# Patient Record
Sex: Male | Born: 1985 | Race: Black or African American | Hispanic: No | Marital: Married | Smoking: Former smoker
Health system: Southern US, Community
[De-identification: ages and names within clinical notes are randomized; demographics above are authoritative.]

## PROBLEM LIST (undated history)

## (undated) DIAGNOSIS — I10 Essential (primary) hypertension: Secondary | ICD-10-CM

---

## 2010-03-10 ENCOUNTER — Emergency Department (HOSPITAL_COMMUNITY)
Admission: EM | Admit: 2010-03-10 | Discharge: 2010-03-10 | Payer: Self-pay | Source: Home / Self Care | Admitting: Family Medicine

## 2010-08-30 ENCOUNTER — Emergency Department (HOSPITAL_COMMUNITY)
Admission: EM | Admit: 2010-08-30 | Discharge: 2010-08-30 | Disposition: A | Discharge status: Home / Self Care | Payer: Self-pay | Attending: Emergency Medicine | Admitting: Emergency Medicine

## 2010-08-30 DIAGNOSIS — M25579 Pain in unspecified ankle and joints of unspecified foot: Secondary | ICD-10-CM | POA: Insufficient documentation

## 2010-08-30 DIAGNOSIS — M25569 Pain in unspecified knee: Secondary | ICD-10-CM | POA: Insufficient documentation

## 2011-06-23 ENCOUNTER — Emergency Department (HOSPITAL_COMMUNITY)
Admission: EM | Admit: 2011-06-23 | Discharge: 2011-06-24 | Disposition: A | Discharge status: Home / Self Care | Payer: Self-pay | Attending: Emergency Medicine | Admitting: Emergency Medicine

## 2011-06-23 ENCOUNTER — Encounter (HOSPITAL_COMMUNITY): Payer: Self-pay | Admitting: Emergency Medicine

## 2011-06-23 DIAGNOSIS — K089 Disorder of teeth and supporting structures, unspecified: Secondary | ICD-10-CM | POA: Insufficient documentation

## 2011-06-23 NOTE — ED Notes (Signed)
PT. REPORTS UPPER AND LOWER MOLAR PAIN FOR SEVERAL DAYS .

## 2011-06-24 NOTE — ED Notes (Signed)
Called x 2 no answer

## 2011-08-20 ENCOUNTER — Emergency Department (HOSPITAL_COMMUNITY): Payer: Self-pay

## 2011-08-20 ENCOUNTER — Emergency Department (HOSPITAL_COMMUNITY)
Admission: EM | Admit: 2011-08-20 | Discharge: 2011-08-20 | Disposition: A | Discharge status: Home / Self Care | Payer: Self-pay | Attending: Emergency Medicine | Admitting: Emergency Medicine

## 2011-08-20 ENCOUNTER — Encounter (HOSPITAL_COMMUNITY): Payer: Self-pay | Admitting: *Deleted

## 2011-08-20 DIAGNOSIS — M25519 Pain in unspecified shoulder: Secondary | ICD-10-CM | POA: Insufficient documentation

## 2011-08-20 DIAGNOSIS — W208XXA Other cause of strike by thrown, projected or falling object, initial encounter: Secondary | ICD-10-CM | POA: Insufficient documentation

## 2011-08-20 DIAGNOSIS — S4980XA Other specified injuries of shoulder and upper arm, unspecified arm, initial encounter: Secondary | ICD-10-CM | POA: Insufficient documentation

## 2011-08-20 DIAGNOSIS — S46909A Unspecified injury of unspecified muscle, fascia and tendon at shoulder and upper arm level, unspecified arm, initial encounter: Secondary | ICD-10-CM | POA: Insufficient documentation

## 2011-08-20 DIAGNOSIS — S4991XA Unspecified injury of right shoulder and upper arm, initial encounter: Secondary | ICD-10-CM

## 2011-08-20 MED ORDER — IBUPROFEN 600 MG PO TABS: 600.0000 mg | ORAL_TABLET | Freq: Four times a day (QID) | ORAL | Status: AC | PRN

## 2011-08-20 MED ORDER — OXYCODONE-ACETAMINOPHEN 5-325 MG PO TABS
1.0000 | ORAL_TABLET | Freq: Once | ORAL | Status: AC
  Administered 2011-08-20: 1 via ORAL
  Filled 2011-08-20: qty 1

## 2011-08-20 NOTE — ED Provider Notes (Signed)
Medical screening examination/treatment/procedure(s) were performed by non-physician practitioner and as supervising physician I was immediately available for consultation/collaboration.   Celene Kras, MD 08/20/11 [phone]

## 2011-08-20 NOTE — ED Notes (Signed)
Ortho notified of sling placement to right arm.

## 2011-08-20 NOTE — ED Provider Notes (Signed)
History     CSN: [id number]  Arrival date & time 08/20/11  1721   First MD Initiated Contact with Patient 08/20/11 1727      No chief complaint on file.   (Consider location/radiation/quality/duration/timing/severity/associated sxs/prior treatment) HPI  26 year old male presents complaining of right arm injury. Patient states he went outside to smoke when a branch from a tree fell and hit him on his right upper arm. Patient states the branch fell due to high wind. He denies hitting his head or having loss of consciousness. He is complaining of pain to his right shoulder and right upper arm. Described pain as a sharp sensation worsening with movement. Sts his hand is tingling. He denies any other injury. He denies bleeding. Patient denies any other numbness sensation. Describe pain is 10 out of 10.  No past medical history on file.  No past surgical history on file.  No family history on file.  History  Substance Use Topics  . Smoking status: Never Smoker   . Smokeless tobacco: Not on file  . Alcohol Use: No      Review of Systems  Constitutional: Negative for fever.  Musculoskeletal: Negative for back pain.  Skin: Negative for rash and wound.  Neurological: Negative for numbness and headaches.    Allergies  Review of patient's allergies indicates no known allergies.  Home Medications  No current outpatient prescriptions on file.  BP 126/81  Pulse 77  Temp 98.3 F (36.8 C) (Oral)  Resp 18  SpO2 100%  Physical Exam  Nursing note and vitals reviewed. Constitutional: He is oriented to person, place, and time. He appears well-developed and well-nourished. No distress.  HENT:  Head: Normocephalic and atraumatic.  Eyes: Conjunctivae are normal.  Neck: Normal range of motion. Neck supple.  Cardiovascular:  Pulses:      Radial pulses are 2+ on the right side.  Pulmonary/Chest: He exhibits no tenderness.  Abdominal: There is no tenderness.  Musculoskeletal:    Right shoulder: He exhibits decreased range of motion, tenderness, bony tenderness and pain. He exhibits no swelling, no effusion, no crepitus, no deformity and no laceration.       Right elbow: He exhibits normal range of motion, no swelling, no effusion, no deformity and no laceration.       Right wrist: Normal.       Arms: Neurological: He is alert and oriented to person, place, and time. No sensory deficit. GCS eye subscore is 4. GCS verbal subscore is 5. GCS motor subscore is 6.  Skin: Skin is warm. No rash noted.    ED Course  Procedures (including critical care time)  Labs Reviewed - No data to display No results found.   No diagnosis found.  No results found for this or any previous visit. Dg Shoulder Right  08/20/2011  *RADIOLOGY REPORT*  Clinical Data: Trauma, pain  RIGHT SHOULDER - 2+ VIEW  Comparison: 08/20/2011  Findings: Normal alignment without fracture.  No significant degenerative process.  Visualized right ribs intact.  IMPRESSION: No acute osseous finding  Original Report Authenticated By: Judie Petit. Ruel Favors, M.D.   Dg Humerus Right  08/20/2011  *RADIOLOGY REPORT*  Clinical Data:  trauma, injury  RIGHT HUMERUS - 2+ VIEW  Comparison: 08/20/2011  Findings:  intact right humerus.  Normal alignment.  Negative for fracture.  IMPRESSION: No acute osseous finding  Original Report Authenticated By: Judie Petit. Ruel Favors, M.D.      MDM  Right shoulder and right upper arm injury from a  falling tree branch. On evaluation patient has no overlying skin changes and no obvious deformity noted. However he is markedly tenderness even with light palpation throughout the shoulders and upper arm. Normal grip strength.  Radial pulse 2+. X-ray ordered, pain medication given. No other obvious injury.        Fayrene Helper, PA-C 08/20/11 1834

## 2011-08-20 NOTE — Progress Notes (Signed)
Orthopedic Tech Progress Note Patient Details:  Darryl Thomas Dec 29, 1985 [id number]  Ortho Devices Type of Ortho Device: Arm foam sling Ortho Device/Splint Location: right arm Ortho Device/Splint Interventions: Application   Nikki Dom 08/20/2011, 6:46 PM

## 2011-08-20 NOTE — Discharge Instructions (Signed)

## 2011-08-20 NOTE — ED Notes (Signed)
Patient brought in by EMS for RIght Upper Extremity Pain- Patient is able to move ext, fingers and has + pulses.  MD currently at bedside

## 2012-03-21 ENCOUNTER — Emergency Department (HOSPITAL_COMMUNITY)
Admission: EM | Admit: 2012-03-21 | Discharge: 2012-03-21 | Disposition: A | Discharge status: Home / Self Care | Payer: Self-pay | Attending: Emergency Medicine | Admitting: Emergency Medicine

## 2012-03-21 ENCOUNTER — Encounter (HOSPITAL_COMMUNITY): Payer: Self-pay | Admitting: *Deleted

## 2012-03-21 ENCOUNTER — Emergency Department (HOSPITAL_COMMUNITY): Payer: Self-pay

## 2012-03-21 DIAGNOSIS — R0602 Shortness of breath: Secondary | ICD-10-CM | POA: Insufficient documentation

## 2012-03-21 DIAGNOSIS — M2550 Pain in unspecified joint: Secondary | ICD-10-CM | POA: Insufficient documentation

## 2012-03-21 DIAGNOSIS — R51 Headache: Secondary | ICD-10-CM | POA: Insufficient documentation

## 2012-03-21 DIAGNOSIS — R509 Fever, unspecified: Secondary | ICD-10-CM | POA: Insufficient documentation

## 2012-03-21 DIAGNOSIS — F17200 Nicotine dependence, unspecified, uncomplicated: Secondary | ICD-10-CM | POA: Insufficient documentation

## 2012-03-21 DIAGNOSIS — J3489 Other specified disorders of nose and nasal sinuses: Secondary | ICD-10-CM | POA: Insufficient documentation

## 2012-03-21 DIAGNOSIS — J069 Acute upper respiratory infection, unspecified: Secondary | ICD-10-CM | POA: Insufficient documentation

## 2012-03-21 DIAGNOSIS — IMO0001 Reserved for inherently not codable concepts without codable children: Secondary | ICD-10-CM | POA: Insufficient documentation

## 2012-03-21 MED ORDER — ACETAMINOPHEN 325 MG PO TABS
650.0000 mg | ORAL_TABLET | Freq: Once | ORAL | Status: AC
  Administered 2012-03-21: 650 mg via ORAL
  Filled 2012-03-21: qty 2

## 2012-03-21 MED ORDER — IBUPROFEN 600 MG PO TABS: 600.0000 mg | ORAL_TABLET | Freq: Four times a day (QID) | ORAL | Status: DC | PRN

## 2012-03-21 MED ORDER — GUAIFENESIN 100 MG/5ML PO SYRP: 100.0000 mg | ORAL_SOLUTION | ORAL | Status: DC | PRN

## 2012-03-21 NOTE — ED Notes (Signed)
Returned from xray

## 2012-03-21 NOTE — ED Notes (Signed)
Patient transported to X-ray 

## 2012-03-21 NOTE — ED Notes (Signed)
Started with cough yesterday. Anterior CP with cough. States has been vomiting today.

## 2012-03-21 NOTE — ED Provider Notes (Signed)
History     CSN: [id number]  Arrival date & time 03/21/12  1036   First MD Initiated Contact with Patient 03/21/12 1054      Chief Complaint  Patient presents with  . Cough    (Consider location/radiation/quality/duration/timing/severity/associated sxs/prior treatment) HPI Darryl Thomas is a 27 y.o. male who presents to ED complaining of cough, congestion, sore throat for the last two days. States his daughter and girlfriend are sick at home. He did not take any medications at home. states went to work, felt worse, and was sent here. he has no medical problems. Does nto take any medications. he is not having shortness of breath, no chest pain. no n/v/d. History reviewed. No pertinent past medical history.  History reviewed. No pertinent past surgical history.  No family history on file.  History  Substance Use Topics  . Smoking status: Current Every Day Smoker  . Smokeless tobacco: Not on file  . Alcohol Use: No      Review of Systems  Constitutional: Positive for fever and chills.  HENT: Positive for congestion and sore throat. Negative for neck pain, neck stiffness, dental problem and voice change.   Respiratory: Positive for cough and shortness of breath.   Cardiovascular: Negative.   Gastrointestinal: Negative.   Musculoskeletal: Positive for myalgias and arthralgias.  Skin: Negative for rash.  Neurological: Positive for headaches.    Allergies  Review of patient's allergies indicates no known allergies.  Home Medications  No current outpatient prescriptions on file.  BP 123/72  Pulse 102  Temp 99.3 F (37.4 C) (Oral)  Resp 18  SpO2 100%  Physical Exam  Vitals reviewed. Constitutional: He appears well-developed and well-nourished. No distress.  Eyes: Conjunctivae normal are normal.  Neck: Normal range of motion. Neck supple.  Cardiovascular: Normal rate, regular rhythm and normal heart sounds.   Pulmonary/Chest: Effort normal and breath sounds normal.  No respiratory distress. He has no wheezes. He has no rales.  Musculoskeletal: He exhibits no edema.  Lymphadenopathy:    He has no cervical adenopathy.  Neurological: He is alert.  Skin: Skin is warm and dry. No rash noted.  Psychiatric: He has a normal mood and affect. His behavior is normal.    ED Course  Procedures (including critical care time)  Labs Reviewed - No data to display Dg Chest 2 View  03/21/2012  *RADIOLOGY REPORT*  Clinical Data: Shortness of breath and vomiting.  CHEST - 2 VIEW  Comparison: None.  Findings: Trachea is midline.  Heart size normal.  Lungs are somewhat low in volume but clear.  No pleural fluid. Visualized portion of the upper abdomen is unremarkable.  IMPRESSION: No acute findings.   Original Report Authenticated By: Leanna Battles, M.D.      1. Viral URI with cough       MDM  Pt with URI symptoms onset yesterda. He is non toxic appearing. CXR negative. VS normal other than HR of 102. Suspect viral URI, vs influenza. Treat symptomatically. Ibuprofen and robitussin at home. Over the counter medications for cold and flu. Follow up if not improving in 2 days.         Lottie Mussel, PA 03/21/12 1640

## 2012-03-21 NOTE — ED Notes (Signed)
Pt is here with cough and yellow sputum, chills, and body aches

## 2012-03-21 NOTE — ED Provider Notes (Signed)
Medical screening examination/treatment/procedure(s) were performed by non-physician practitioner and as supervising physician I was immediately available for consultation/collaboration.   Charles B. Bernette Mayers, MD 03/21/12 2140

## 2013-07-19 ENCOUNTER — Emergency Department (HOSPITAL_COMMUNITY)
Admission: EM | Admit: 2013-07-19 | Discharge: 2013-07-19 | Disposition: A | Discharge status: Home / Self Care | Payer: Self-pay | Attending: Emergency Medicine | Admitting: Emergency Medicine

## 2013-07-19 ENCOUNTER — Encounter (HOSPITAL_COMMUNITY): Payer: Self-pay | Admitting: Emergency Medicine

## 2013-07-19 DIAGNOSIS — S61509A Unspecified open wound of unspecified wrist, initial encounter: Secondary | ICD-10-CM | POA: Insufficient documentation

## 2013-07-19 DIAGNOSIS — F17200 Nicotine dependence, unspecified, uncomplicated: Secondary | ICD-10-CM | POA: Insufficient documentation

## 2013-07-19 DIAGNOSIS — W268XXA Contact with other sharp object(s), not elsewhere classified, initial encounter: Secondary | ICD-10-CM | POA: Insufficient documentation

## 2013-07-19 DIAGNOSIS — S61511A Laceration without foreign body of right wrist, initial encounter: Secondary | ICD-10-CM

## 2013-07-19 DIAGNOSIS — Y929 Unspecified place or not applicable: Secondary | ICD-10-CM | POA: Insufficient documentation

## 2013-07-19 DIAGNOSIS — Y9389 Activity, other specified: Secondary | ICD-10-CM | POA: Insufficient documentation

## 2013-07-19 NOTE — ED Notes (Signed)
Pt. Requesting pain medicine prescription but not willing to wait for RN to ask PA. Pt. Left without prescription.

## 2013-07-19 NOTE — ED Notes (Signed)
Previous triage entered by Ellin GoodieMegan Tayjah Lobdell, RN

## 2013-07-19 NOTE — ED Notes (Signed)
Pt presents to department for evaluation of R wrist laceration. Pt states he accidentally cut his hand with wire. 1 inch laceration noted upon arrival, bleeding controlled. Able to wiggle digits, sensation to hand intact. Pt is alert and oriented x4.

## 2013-07-19 NOTE — Discharge Instructions (Signed)
1. Medications: usual home medications 2. Treatment: rest, drink plenty of fluids, keep wound clean with warm soap and water, keep bandage dry 3. Follow Up: [facility] your primary care physician for suture removal and wound check in 7 days, return to the emergency department for evidence of infection including fevers, erythema or drainage  Sutured Wound Care Sutures are stitches that can be used to close wounds. Wound care helps prevent pain and infection.  HOME CARE INSTRUCTIONS   Rest and elevate the injured area until all the pain and swelling are gone.  Only take over-the-counter or prescription medicines for pain, discomfort, or fever as directed by your caregiver.  After 48 hours, gently wash the area with mild soap and water once a day, or as directed. Rinse off the soap. Pat the area dry with a clean towel. Do not rub the wound. This may cause bleeding.  Follow your caregiver's instructions for how often to change the bandage (dressing). Stop using a dressing after 2 days or after the wound stops draining.  If the dressing sticks, moisten it with soapy water and gently remove it.  Apply ointment on the wound as directed.  Avoid stretching a sutured wound.  Drink enough fluids to keep your urine clear or pale yellow.  Follow up with your caregiver for suture removal as directed.  Use sunscreen on your wound for the next 3 to 6 months so the scar will not darken. SEEK IMMEDIATE MEDICAL CARE IF:   Your wound becomes red, swollen, hot, or tender.  You have increasing pain in the wound.  You have a red streak that extends from the wound.  There is pus coming from the wound.  You have a fever.  You have shaking chills.  There is a bad smell coming from the wound.  You have persistent bleeding from the wound. MAKE SURE YOU:   Understand these instructions.  Will watch your condition.  Will get help right away if you are not doing  well or get worse. Document Released: 04/02/2004 Document Revised: 05/18/2011 Document Reviewed: 06/29/2010 [facility] Patient Information 2014 [place], [place].

## 2013-07-19 NOTE — ED Provider Notes (Signed)
Medical screening examination/treatment/procedure(s) were performed by non-physician practitioner and as supervising physician I was immediately available for consultation/collaboration.   Simya Tercero T Carrington Olazabal, MD 07/19/13 2320 

## 2013-07-19 NOTE — ED Provider Notes (Signed)
CSN: [id number]     Arrival date & time 07/19/13  1356 History   First MD Initiated Contact with Patient 07/19/13 1515     Chief Complaint  Patient presents with  . Laceration     (Consider location/radiation/quality/duration/timing/severity/associated sxs/prior Treatment) Patient is a 28 y.o. male presenting with skin laceration. The history is provided by the patient and medical records. No language interpreter was used.  Laceration   Darryl Thomas is a 28 y.o. male  with no known medical history presents to the Emergency Department complaining of acute, persistent laceration of the right wrist which occurred approximately one hour prior to arrival. He states he was taking apart a washing machine when he accidentally cut his wrist with a wire. He denies other associated symptoms. No aggravating or alleviating factors. He denies fever, chills, nausea or vomiting.  Patient without a history of diabetes.  Tetanus UTD in the last year.      History reviewed. No pertinent past medical history. History reviewed. No pertinent past surgical history. History reviewed. No pertinent family history. History  Substance Use Topics  . Smoking status: Current Every Day Smoker    Types: Cigarettes  . Smokeless tobacco: Not on file  . Alcohol Use: No    Review of Systems  Constitutional: Negative for fever.  Gastrointestinal: Negative for nausea and vomiting.  Skin: Positive for wound.  Allergic/Immunologic: Negative for immunocompromised state.  Neurological: Negative for weakness and numbness.  Hematological: Does not bruise/bleed easily.  Psychiatric/Behavioral: The patient is not nervous/anxious.       Allergies  Review of patient's allergies indicates no known allergies.  Home Medications   Prior to Admission medications   Not on File   BP 125/63  Pulse 80  Temp(Src) 97.6 F (36.4 C) (Oral)  Resp 18  SpO2 100% Physical Exam  Nursing note and vitals  reviewed. Constitutional: He is oriented to person, place, and time. He appears well-developed and well-nourished. No distress.  HENT:  Head: Normocephalic and atraumatic.  Eyes: Conjunctivae are normal. No scleral icterus.  Neck: Normal range of motion.  Cardiovascular: Normal rate, regular rhythm, normal heart sounds and intact distal pulses.   No murmur heard. Capillary refill < 3 sec  Pulmonary/Chest: Effort normal and breath sounds normal. No respiratory distress.  Musculoskeletal: Normal range of motion. He exhibits no edema.  ROM: Full range of motion of all fingers of the right hand and right wrist  Neurological: He is alert and oriented to person, place, and time.  Sensation: Intact to dull and sharp including 2 point discrimination intact to all fingers of the right hand Strength: 5 out of 5 including resisted flexion and extension of all fingers of the right hand and right wrist  Skin: Skin is warm and dry. He is not diaphoretic. No erythema.  3cm laceration to the right lateral wrist; clean edges and the wound is clean  Psychiatric: He has a normal mood and affect.    ED Course  LACERATION REPAIR Date/Time: 07/19/2013 3:29 PM Performed by: Dierdre ForthMUTHERSBAUGH, Areon Cocuzza Authorized by: Dierdre ForthMUTHERSBAUGH, Jeramia Saleeby Consent: Verbal consent obtained. Risks and benefits: risks, benefits and alternatives were discussed Consent given by: patient Patient understanding: patient states understanding of the procedure being performed Patient consent: the patient's understanding of the procedure matches consent given Procedure consent: procedure consent matches procedure scheduled Relevant documents: relevant documents present and verified Site marked: the operative site was marked Required items: required blood products, implants, devices, and special equipment available Patient identity confirmed: verbally  with patient and arm band Time out: Immediately prior to procedure a "time out" was called to  verify the correct patient, procedure, equipment, support staff and site/side marked as required. Body area: upper extremity Location details: right wrist Laceration length: 3 cm Foreign bodies: no foreign bodies Tendon involvement: none Nerve involvement: none Vascular damage: no Anesthesia: local infiltration Local anesthetic: lidocaine 2% without epinephrine Anesthetic total: 3.5 ml Patient sedated: no Preparation: Patient was prepped and draped in the usual sterile fashion. Irrigation solution: saline Irrigation method: syringe Amount of cleaning: standard Debridement: none Degree of undermining: none Skin closure: Ethilon Number of sutures: 4 Technique: simple Approximation: close Approximation difficulty: simple Dressing: 4x4 sterile gauze Patient tolerance: Patient tolerated the procedure well with no immediate complications.   (including critical care time) Labs Review Labs Reviewed - No data to display  Imaging Review No results found.   EKG Interpretation None      MDM   Final diagnoses:  Laceration of right wrist   Darryl Thomas presents for laceration to the right wrist.  Tdap UTD within the last year.  Pressure irrigation performed. Laceration occurred < 8 hours prior to repair which was well tolerated. Pt has no co morbidities to effect normal wound healing. Discussed suture home care w pt and answered questions. 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 Pt to f-u for wound check and suture removal in 7 days. Pt is hemodynamically stable w no complaints prior to dc.    It has been determined that no acute conditions requiring further emergency intervention are present at this time. The patient/guardian have been advised of the diagnosis and plan. We have discussed signs and symptoms that warrant return to the ED, such as changes or worsening in symptoms.   Vital signs are stable at discharge.   BP 125/63  Pulse 80  Temp(Src) 97.6  F (36.4 C) (Oral)  Resp 18  SpO2 100%  Patient/guardian has voiced understanding and agreed to follow-up with the PCP or specialist.       Dierdre ForthHannah Ileta Ofarrell, PA-C 07/19/13 1555

## 2013-07-19 NOTE — ED Notes (Signed)
Patient states was working on a dryer and a wire cut him in the R wrist.  Patient states he is unsure if anything is in the laceration.

## 2013-08-22 ENCOUNTER — Encounter (HOSPITAL_COMMUNITY): Payer: Self-pay | Admitting: Emergency Medicine

## 2013-08-22 ENCOUNTER — Emergency Department (HOSPITAL_COMMUNITY)
Admission: EM | Admit: 2013-08-22 | Discharge: 2013-08-22 | Disposition: A | Discharge status: Home / Self Care | Payer: Self-pay | Attending: Emergency Medicine | Admitting: Emergency Medicine

## 2013-08-22 ENCOUNTER — Emergency Department (HOSPITAL_COMMUNITY): Payer: Self-pay

## 2013-08-22 DIAGNOSIS — Y9239 Other specified sports and athletic area as the place of occurrence of the external cause: Secondary | ICD-10-CM | POA: Insufficient documentation

## 2013-08-22 DIAGNOSIS — Y92838 Other recreation area as the place of occurrence of the external cause: Secondary | ICD-10-CM

## 2013-08-22 DIAGNOSIS — S93409A Sprain of unspecified ligament of unspecified ankle, initial encounter: Secondary | ICD-10-CM | POA: Insufficient documentation

## 2013-08-22 DIAGNOSIS — Y9367 Activity, basketball: Secondary | ICD-10-CM | POA: Insufficient documentation

## 2013-08-22 DIAGNOSIS — F17200 Nicotine dependence, unspecified, uncomplicated: Secondary | ICD-10-CM | POA: Insufficient documentation

## 2013-08-22 DIAGNOSIS — X500XXA Overexertion from strenuous movement or load, initial encounter: Secondary | ICD-10-CM | POA: Insufficient documentation

## 2013-08-22 MED ORDER — OXYCODONE-ACETAMINOPHEN 5-325 MG PO TABS
1.0000 | ORAL_TABLET | Freq: Once | ORAL | Status: AC
  Administered 2013-08-22: 1 via ORAL
  Filled 2013-08-22: qty 1

## 2013-08-22 MED ORDER — HYDROCODONE-ACETAMINOPHEN 5-325 MG PO TABS: 1.0000 | ORAL_TABLET | Freq: Four times a day (QID) | ORAL | Status: DC | PRN

## 2013-08-22 NOTE — Progress Notes (Signed)
Orthopedic Tech Progress Note Patient Details:  Darryl PickettRaymon Thomas 02/10/1986 [id number]  Ortho Devices Type of Ortho Device: ASO;Crutches Ortho Device/Splint Location: rle Ortho Device/Splint Interventions: Application   Nikki Domrawford, Sumayyah Custodio 08/22/2013, 4:44 PM

## 2013-08-22 NOTE — ED Provider Notes (Signed)
CSN: [id number]     Arrival date & time 08/22/13  1333 History  This chart was scribed for non-physician practitioner, Ivar Drapeob Browning, working with Joya Gaskinsonald W Wickline, MD by Milly JakobJohn Lee Graves, ED Scribe. The patient was seen in room TR07C/TR07C. Patient's care was started at 2:04 PM.      Chief Complaint  Patient presents with  . Ankle Pain   The history is provided by the patient. No language interpreter was used.   HPI Comments: Quintus Nelly RoutDawkins is a 28 y.o. male who presents to the Emergency Department complaining of constant, moderate right ankle pain onset this morning. Patient states that he rolled his ankle playing basketball. Patient reports that this pain radiates up his leg. He has not tried taking anything for the pain. He states that as I reviewed with walking. It is relieved with rest.  History reviewed. No pertinent past medical history. History reviewed. No pertinent past surgical history. History reviewed. No pertinent family history. History  Substance Use Topics  . Smoking status: Current Every Day Smoker    Types: Cigarettes  . Smokeless tobacco: Not on file  . Alcohol Use: No    Review of Systems  Constitutional: Negative for fever and chills.  Respiratory: Negative for shortness of breath.   Cardiovascular: Negative for chest pain.  Gastrointestinal: Negative for nausea, vomiting, diarrhea and constipation.  Genitourinary: Negative for dysuria.  Musculoskeletal: Positive for arthralgias.      Allergies  Review of patient's allergies indicates no known allergies.  Home Medications   Prior to Admission medications   Not on File   Triage Vitals: BP 107/75  Pulse 80  Temp(Src) 98.3 F (36.8 C)  Resp 18  Wt 152 lb (68.947 kg)  SpO2 100% Physical Exam  Nursing note and vitals reviewed. Constitutional: He is oriented to person, place, and time. He appears well-developed and well-nourished. No distress.  HENT:  Head: Normocephalic and atraumatic.  Eyes:  Conjunctivae and EOM are normal.  Neck: Normal range of motion. Neck supple. No tracheal deviation present.  Cardiovascular: Normal rate.   Pulmonary/Chest: Effort normal. No respiratory distress.  Abdominal: He exhibits no distension.  Musculoskeletal: Normal range of motion.  Right ankle tender to palpation over the ATFL, no bony abnormality or deformity, range of motion and strength is limited secondary to pain, moderate swelling about the ankle  Neurological: He is alert and oriented to person, place, and time.  Skin: Skin is warm and dry.  Psychiatric: He has a normal mood and affect. His behavior is normal. Judgment and thought content normal.    ED Course  Procedures (including critical care time) DIAGNOSTIC STUDIES: Oxygen Saturation is 100% on room air, normal by my interpretation.    COORDINATION OF CARE:   Labs Review Labs Reviewed - No data to display  Imaging Review Dg Ankle Complete Right  08/22/2013   CLINICAL DATA:  Ankle pain  EXAM: RIGHT ANKLE - COMPLETE 3+ VIEW  COMPARISON:  None.  FINDINGS: Increased sclerosis is noted in the tarsal navicular bone with some degenerative changes of the articulation between the talus and the navicular bone. There is a lucency seen in the posterior aspect of the talus which may be related to a mildly displaced posterior talar fracture. Some mild irregularity is also noted in the anterior aspect of the distal tibia which may be related to a mild cortical fracture. Given the patient's inability to adequately position himself CT may be helpful for further evaluation. Generalized soft tissue swelling is  noted.  IMPRESSION: Abnormalities as described above which may be better evaluated on CT examination given the patient's inability to adequately position himself.   Electronically Signed   By: Alcide CleverMark  Lukens M.D.   On: 08/22/2013 14:46   Ct Ankle Right Wo Contrast  08/22/2013   CLINICAL DATA:  Basketball injury, pain.  EXAM: CT OF THE RIGHT  ANKLE WITHOUT CONTRAST  TECHNIQUE: Multidetector CT imaging was performed according to the standard protocol. Multiplanar CT image reconstructions were also generated.  COMPARISON:  Plain films earlier this same day.  FINDINGS: No acute fracture is identified. The patient has a dysplastic talus. The dorsal process of the talus is markedly hypertrophied and incompletely fused to the posterior margin of the talar dome. This accounts for finding on plain film. The anterior tibia is intact. The patient has a flat talar dome. The posterior middle facets of the subtalar joint also appear mildly dysplastic, more notable in the posterior facet. Small subchondral cyst in posterior talus at the posterior facet is noted. 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 There is cystic change and flattening in the lateral half of the navicular. No tibiotalar joint effusion is identified. No notable soft tissue swelling about the ankle is seen. As visualized by CT scan, ligaments and tendons appear intact.  The left ankle is imaged in the axial plane only and demonstrates dysplastic appearing posterior and middle facets of the subtalar joint. Prominent dorsal process of the calcaneus is identified.  IMPRESSION: Negative for acute fracture.  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 Flattening of the talar dome and mildly dysplastic subtalar joint are also identified.  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   Electronically Signed   By: Drusilla Kannerhomas  Dalessio M.D.   On: 08/22/2013 16:00     EKG Interpretation None      MDM   Final diagnoses:  Ankle sprain    Patient with ankle sprain. Imaging as above. Recommend orthopedic followup in one week. Discussed patient and imaging with Dr. Effie ShyWentz, who agrees with the plan.  I personally performed the services  described in this documentation, which was scribed in my presence. The recorded information has been reviewed and is accurate.     Roxy Horsemanobert Browning, PA-C 08/22/13 [id number]

## 2013-08-22 NOTE — ED Notes (Signed)
Per pt sts he was playing basketball today and sprained right ankle.

## 2013-08-22 NOTE — Discharge Planning (Signed)
[facility] Community Liaison  Spoke to patient about primary care resources and establishing care with a provider. Patient was given the orange card application and resource guide. Patient was also provided with my contact information for any future questions or concerns.

## 2013-08-22 NOTE — ED Notes (Signed)
Back from xray

## 2013-08-22 NOTE — Discharge Instructions (Signed)

## 2013-08-23 NOTE — ED Provider Notes (Signed)
Medical screening examination/treatment/procedure(s) were performed by non-physician practitioner and as supervising physician I was immediately available for consultation/collaboration.  Takiyah Bohnsack L Liya Strollo, MD 08/23/13 0014 

## 2013-09-15 ENCOUNTER — Other Ambulatory Visit (HOSPITAL_COMMUNITY): Payer: Self-pay | Admitting: Orthopaedic Surgery

## 2013-09-15 DIAGNOSIS — M25571 Pain in right ankle and joints of right foot: Secondary | ICD-10-CM

## 2013-10-05 ENCOUNTER — Other Ambulatory Visit (HOSPITAL_COMMUNITY): Payer: Self-pay | Admitting: Orthopaedic Surgery

## 2013-10-05 DIAGNOSIS — M25571 Pain in right ankle and joints of right foot: Secondary | ICD-10-CM

## 2013-10-16 ENCOUNTER — Ambulatory Visit (HOSPITAL_COMMUNITY): Admission: RE | Admit: 2013-10-16 | Payer: Self-pay | Source: Ambulatory Visit

## 2013-10-17 ENCOUNTER — Ambulatory Visit (HOSPITAL_COMMUNITY): Admission: RE | Admit: 2013-10-17 | Payer: Self-pay | Source: Ambulatory Visit

## 2013-10-20 ENCOUNTER — Ambulatory Visit (HOSPITAL_COMMUNITY): Admission: RE | Admit: 2013-10-20 | Payer: Self-pay | Source: Ambulatory Visit

## 2013-10-24 ENCOUNTER — Ambulatory Visit (HOSPITAL_COMMUNITY)
Admission: RE | Admit: 2013-10-24 | Discharge: 2013-10-24 | Disposition: A | Discharge status: Home / Self Care | Payer: Self-pay | Source: Ambulatory Visit | Attending: Orthopaedic Surgery | Admitting: Orthopaedic Surgery

## 2013-10-24 DIAGNOSIS — M249 Joint derangement, unspecified: Secondary | ICD-10-CM | POA: Insufficient documentation

## 2013-10-24 DIAGNOSIS — M24176 Other articular cartilage disorders, unspecified foot: Secondary | ICD-10-CM | POA: Insufficient documentation

## 2013-10-24 DIAGNOSIS — M24173 Other articular cartilage disorders, unspecified ankle: Secondary | ICD-10-CM | POA: Insufficient documentation

## 2013-10-24 DIAGNOSIS — X58XXXA Exposure to other specified factors, initial encounter: Secondary | ICD-10-CM | POA: Insufficient documentation

## 2013-10-24 DIAGNOSIS — M8708 Idiopathic aseptic necrosis of bone, other site: Secondary | ICD-10-CM | POA: Insufficient documentation

## 2014-08-08 ENCOUNTER — Emergency Department (HOSPITAL_COMMUNITY)
Admission: EM | Admit: 2014-08-08 | Discharge: 2014-08-08 | Disposition: A | Discharge status: Home / Self Care | Payer: Self-pay | Attending: Emergency Medicine | Admitting: Emergency Medicine

## 2014-08-08 ENCOUNTER — Encounter (HOSPITAL_COMMUNITY): Payer: Self-pay | Admitting: Emergency Medicine

## 2014-08-08 DIAGNOSIS — Z720 Tobacco use: Secondary | ICD-10-CM | POA: Insufficient documentation

## 2014-08-08 DIAGNOSIS — R42 Dizziness and giddiness: Secondary | ICD-10-CM | POA: Insufficient documentation

## 2014-08-08 LAB — URINALYSIS, ROUTINE W REFLEX MICROSCOPIC
Bilirubin Urine: NEGATIVE
Glucose, UA: NEGATIVE mg/dL
Hgb urine dipstick: NEGATIVE
Ketones, ur: NEGATIVE mg/dL
LEUKOCYTES UA: NEGATIVE
Nitrite: NEGATIVE
Protein, ur: NEGATIVE mg/dL
SPECIFIC GRAVITY, URINE: 1.017 (ref 1.005–1.030)
UROBILINOGEN UA: 0.2 mg/dL (ref 0.0–1.0)
pH: 7 (ref 5.0–8.0)

## 2014-08-08 LAB — BASIC METABOLIC PANEL
Anion gap: 5 (ref 5–15)
BUN: 15 mg/dL (ref 6–20)
CO2: 25 mmol/L (ref 22–32)
Calcium: 9.1 mg/dL (ref 8.9–10.3)
Chloride: 108 mmol/L (ref 101–111)
Creatinine, Ser: 0.81 mg/dL (ref 0.61–1.24)
GFR calc Af Amer: >60 mL/min (ref >60)
Glucose, Bld: 96 mg/dL (ref 65–99)
Potassium: 3.7 mmol/L (ref 3.5–5.1)
Sodium: 138 mmol/L (ref 135–145)

## 2014-08-08 LAB — CBC
HCT: 37.3 % — ABNORMAL LOW (ref 39.0–52.0)
Hemoglobin: 13 g/dL (ref 13.0–17.0)
MCH: 32.9 pg (ref 26.0–34.0)
MCHC: 34.9 g/dL (ref 30.0–36.0)
MCV: 94.4 fL (ref 78.0–100.0)
Platelets: 171 10*3/uL (ref 150–400)
RBC: 3.95 MIL/uL — AB (ref 4.22–5.81)
RDW: 18.1 % — ABNORMAL HIGH (ref 11.5–15.5)
WBC: 9.2 10*3/uL (ref 4.0–10.5)

## 2014-08-08 LAB — CBG MONITORING, ED: Glucose-Capillary: 94 mg/dL (ref 65–99)

## 2014-08-08 MED ORDER — SODIUM CHLORIDE 0.9 % IV BOLUS (SEPSIS)
1000.0000 mL | Freq: Once | INTRAVENOUS | Status: AC
  Administered 2014-08-08: 1000 mL via INTRAVENOUS

## 2014-08-08 NOTE — ED Notes (Signed)
Pt given water to drink. 

## 2014-08-08 NOTE — ED Notes (Signed)
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 Pt states that he ate a double cheese burger.

## 2014-08-08 NOTE — Discharge Instructions (Signed)
Dizziness °Dizziness is a common problem. It is a feeling of unsteadiness or light-headedness. You may feel like you are about to faint. Dizziness can lead to injury if you stumble or fall. A person of any age group can suffer from dizziness, but dizziness is more common in older adults. °CAUSES  °Dizziness can be caused by many different things, including: °· Middle ear problems. °· Standing for too long. °· Infections. °· An allergic reaction. °· Aging. °· An emotional response to something, such as the sight of blood. °· Side effects of medicines. °· Tiredness. °· Problems with circulation or blood pressure. °· Excessive use of alcohol or medicines, or illegal drug use. °· Breathing too fast (hyperventilation). °· An irregular heart rhythm (arrhythmia). °· A low red blood cell count (anemia). °· Pregnancy. °· Vomiting, diarrhea, fever, or other illnesses that cause body fluid loss (dehydration). °· Diseases or conditions such as Parkinson's disease, high blood pressure (hypertension), diabetes, and thyroid problems. °· Exposure to extreme heat. °DIAGNOSIS  °Your health care provider will ask about your symptoms, perform a physical exam, and perform an electrocardiogram (ECG) to record the electrical activity of your heart. Your health care provider may also perform other heart or blood tests to determine the cause of your dizziness. These may include: °· Transthoracic echocardiogram (TTE). During echocardiography, sound waves are used to evaluate how blood flows through your heart. °· Transesophageal echocardiogram (TEE). °· Cardiac monitoring. This allows your health care provider to monitor your heart rate and rhythm in real time. °· Holter monitor. This is a portable device that records your heartbeat and can help diagnose heart arrhythmias. It allows your health care provider to track your heart activity for several days if needed. °· Stress tests by exercise or by giving medicine that makes the heart beat  faster. °TREATMENT  °Treatment of dizziness depends on the cause of your symptoms and can vary greatly. °HOME CARE INSTRUCTIONS  °· Drink enough fluids to keep your urine clear or pale yellow. This is especially important in very hot weather. In older adults, it is also important in cold weather. °· Take your medicine exactly as directed if your dizziness is caused by medicines. When taking blood pressure medicines, it is especially important to get up slowly. °· Rise slowly from chairs and steady yourself until you feel okay. °· In the morning, first sit up on the side of the bed. When you feel okay, stand slowly while holding onto something until you know your balance is fine. °· Move your legs often if you need to stand in one place for a long time. Tighten and relax your muscles in your legs while standing. °· Have someone stay with you for 1-2 days if dizziness continues to be a problem. Do this until you feel you are well enough to stay alone. Have the person call your health care provider if he or she notices changes in you that are concerning. °· Do not drive or use heavy machinery if you feel dizzy. °· Do not drink alcohol. °SEEK IMMEDIATE MEDICAL CARE IF:  °· Your dizziness or light-headedness gets worse. °· You feel nauseous or vomit. °· You have problems talking, walking, or using your arms, hands, or legs. °· You feel weak. °· You are not thinking clearly or you have trouble forming sentences. It may take a friend or family member to notice this. °· You have chest pain, abdominal pain, shortness of breath, or sweating. °· Your vision changes. °· You notice   any bleeding. °· You have side effects from medicine that seems to be getting worse rather than better. °MAKE SURE YOU:  °· Understand these instructions. °· Will watch your condition. °· Will get help right away if you are not doing well or get worse. °Document Released: 08/19/2000 Document Revised: 02/28/2013 Document Reviewed: 09/12/2010 °ExitCare®  Patient Information ©2015 [employer]. This information is not intended to replace advice given to you by your health care provider. Make sure you discuss any questions you have with your health care provider. °Near-Syncope °Near-syncope (commonly known as near fainting) is sudden weakness, dizziness, or feeling like you might pass out. During an episode of near-syncope, you may also develop pale skin, have tunnel vision, or feel sick to your stomach (nauseous). Near-syncope may occur when getting up after sitting or while standing for a long time. It is caused by a sudden decrease in blood flow to the brain. This decrease can result from various causes or triggers, most of which are not serious. However, because near-syncope can sometimes be a sign of something serious, a medical evaluation is required. The specific cause is often not determined. °HOME CARE INSTRUCTIONS  °Monitor your condition for any changes. The following actions may help to alleviate any discomfort you are experiencing: °· Have someone stay with you until you feel stable. °· Lie down right away and prop your feet up if you start feeling like you might faint. Breathe deeply and steadily. Wait until all the symptoms have passed. Most of these episodes last only a few minutes. You may feel tired for several hours.   °· Drink enough fluids to keep your urine clear or pale yellow.   °· If you are taking blood pressure or heart medicine, get up slowly when seated or lying down. Take several minutes to sit and then stand. This can reduce dizziness. °· Follow up with your health care provider as directed.  °SEEK IMMEDIATE MEDICAL CARE IF:  °· You have a severe headache.   °· You have unusual pain in the chest, abdomen, or back.   °· You are bleeding from the mouth or rectum, or you have black or tarry stool.   °· You have an irregular or very fast heartbeat.   °· You have repeated fainting or have seizure-like jerking during an episode.   °· You  faint when sitting or lying down.   °· You have confusion.   °· You have difficulty walking.   °· You have severe weakness.   °· You have vision problems.   °MAKE SURE YOU:  °· Understand these instructions. °· Will watch your condition. °· Will get help right away if you are not doing well or get worse. °Document Released: 02/23/2005 Document Revised: 02/28/2013 Document Reviewed: 07/29/2012 °ExitCare® Patient Information ©2015 [employer]. This information is not intended to replace advice given to you by your health care provider. Make sure you discuss any questions you have with your health care provider. ° °

## 2014-08-08 NOTE — ED Provider Notes (Signed)
CSN: [id number]     Arrival date & time 08/08/14  1639 History   First MD Initiated Contact with Patient 08/08/14 1644     Chief Complaint  Patient presents with  . Dizziness     (Consider location/radiation/quality/duration/timing/severity/associated sxs/prior Treatment) HPI The patient was at work and cleaning a car. He states he was stooped over with his head down and when he stood up his head was spinning intensely. He reports he could feel the blood pounding in his head. He got very lightheaded and had to sit back down. He reports he continued to have some spinning dizziness for while after that but reports that that is resolved now. At the time it happened he also felt his vision getting dark. His vision is now back to normal as well. He denies he's been ill recently. He denies fever chills, nasal congestion, earache, cough, shortness of breath or chest pain. History reviewed. No pertinent past medical history. History reviewed. No pertinent past surgical history. No family history on file. History  Substance Use Topics  . Smoking status: Current Every Day Smoker    Types: Cigarettes  . Smokeless tobacco: Not on file  . Alcohol Use: No    Review of Systems 10 Systems reviewed and are negative for acute change except as noted in the HPI.    Allergies  Review of patient's allergies indicates no known allergies.  Home Medications   Prior to Admission medications   Medication Sig Start Date End Date Taking? Authorizing Provider  HYDROcodone-acetaminophen (NORCO/VICODIN) 5-325 MG per tablet Take 1-2 tablets by mouth every 6 (six) hours as needed. Patient not taking: Reported on 08/08/2014 08/22/13   Roxy Horseman, PA-C   BP 139/88 mmHg  Pulse 88  Temp(Src) 100 F (37.8 C) (Oral)  Resp 18  SpO2 99% Physical Exam  Constitutional: He is oriented to person, place, and time. He appears well-developed and well-nourished.  HENT:  Head: Normocephalic and atraumatic.  Right Ear:  External ear normal.  Left Ear: External ear normal.  Nose: Nose normal.  Mouth/Throat: Oropharynx is clear and moist.  Bilateral TMs normal without erythema or bulging  Eyes: EOM are normal. Pupils are equal, round, and reactive to light. Right eye exhibits no discharge. Left eye exhibits no discharge.  Neck: Neck supple.  Cardiovascular: Normal rate, regular rhythm, normal heart sounds and intact distal pulses.   Pulmonary/Chest: Effort normal and breath sounds normal.  Abdominal: Soft. Bowel sounds are normal. He exhibits no distension. There is no tenderness.  Musculoskeletal: Normal range of motion. He exhibits no edema.  Neurological: He is alert and oriented to person, place, and time. He has normal strength. No cranial nerve deficit. He exhibits normal muscle tone. Coordination normal. GCS eye subscore is 4. GCS verbal subscore is 5. GCS motor subscore is 6.  Normal finger-nose examination bilaterally  Skin: Skin is warm, dry and intact.  Psychiatric: He has a normal mood and affect.    ED Course  Procedures (including critical care time) Labs Review Labs Reviewed  CBC - Abnormal; Notable for the following:    RBC 3.95 (*)    HCT 37.3 (*)    RDW 18.1 (*)    All other components within normal limits  BASIC METABOLIC PANEL  URINALYSIS, ROUTINE W REFLEX MICROSCOPIC (NOT AT [facility])  CBG MONITORING, ED    Imaging Review No results found.   EKG Interpretation   Date/Time:  Wednesday August 08 2014 16:53:50 EDT Ventricular Rate:  83 PR Interval:  165 QRS Duration: 89 QT Interval:  356 QTC Calculation: 418 R Axis:   33 Text Interpretation:  Sinus rhythm ST elev, probable normal early repol  pattern agree Confirmed by Donnald GarrePfeiffer, MD, Lebron ConnersMarcy [phone]([id number]) on 08/08/2014 6:52:09  PM      MDM   Final diagnoses:  Orthostatic dizziness   Findings consistent with an orthostatic dizziness. Symptoms were brought on by being from a stooped over position to standing up straight. Patient  is review systems does not suggest other illness. At this time he feels improved and symptoms are resolved. Patient be counseled to follow-up with a primary provider for any further diagnostic evaluation if needed.    Arby BarretteMarcy Lacresha Fusilier, MD 08/08/14 2034

## 2014-10-10 IMAGING — CT CT ANKLE*R* W/O CM
3 of 4 series · 15 of 33 positions shown, 17 images · non-contrast
Comparison: Plain films earlier this same day.

CLINICAL DATA: Basketball injury, pain.

EXAM:
CT OF THE RIGHT ANKLE WITHOUT CONTRAST
TECHNIQUE: Multidetector CT imaging was performed according to the standard
protocol. Multiplanar CT image reconstructions were also generated.

[Series 4: lfov ext 3.0 b40s · axial · 0.58mm/px · z∈[+171,+369]mm · 7 of 78 slices shown, 9 images]
[im 6/78  soft-tissue]
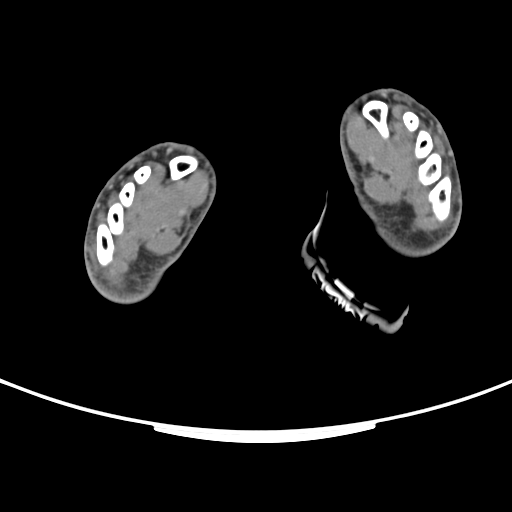
[im 6/78  bone]
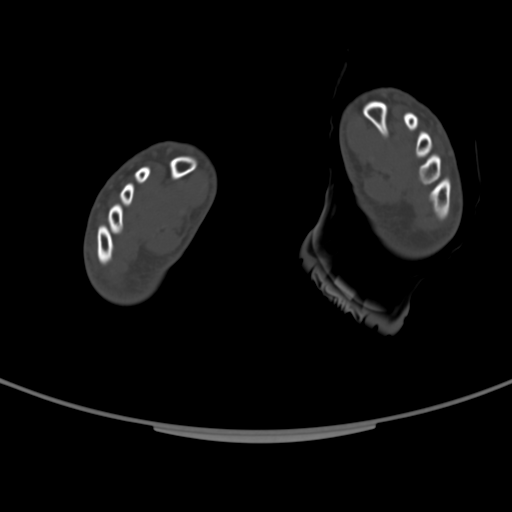
[im 18/78  bone]
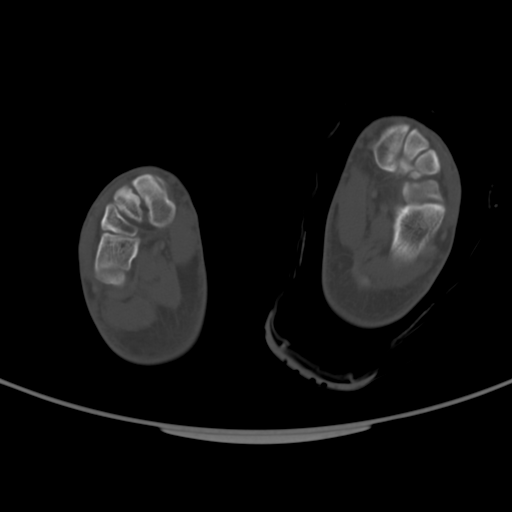
[im 30/78  bone]
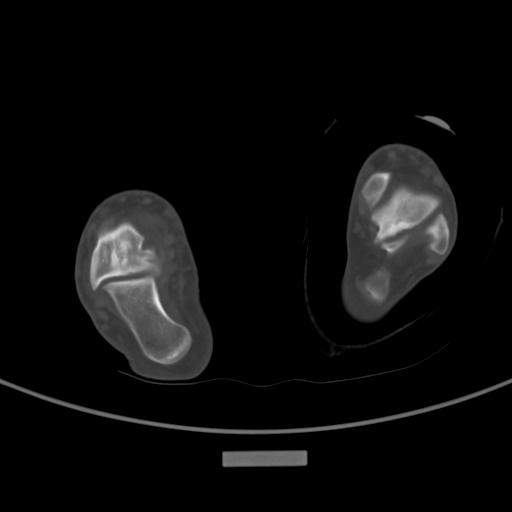
[im 42/78  bone]
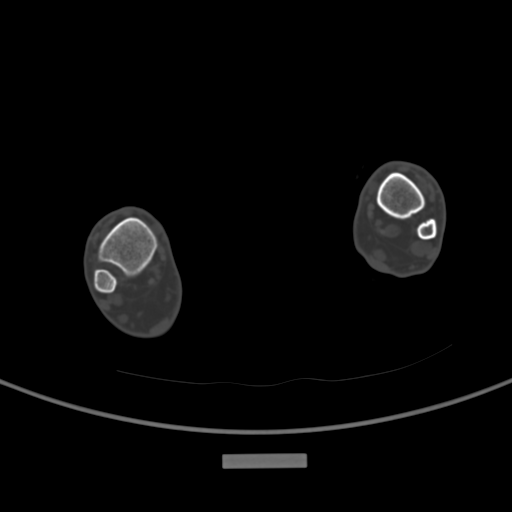
[im 48/78  soft-tissue]
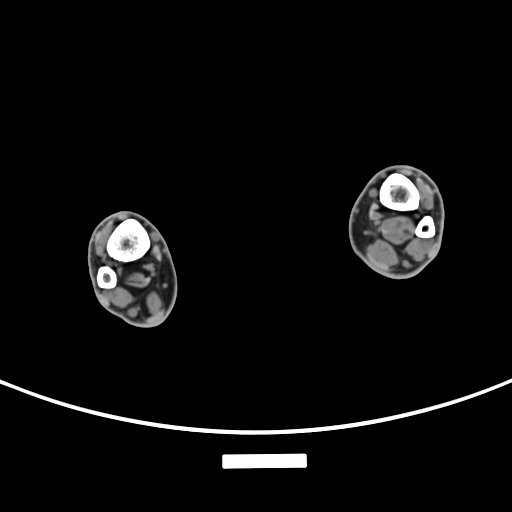
[im 48/78  bone]
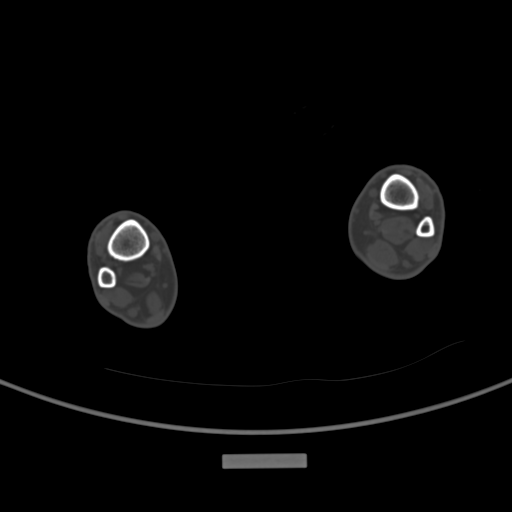
[im 60/78  bone]
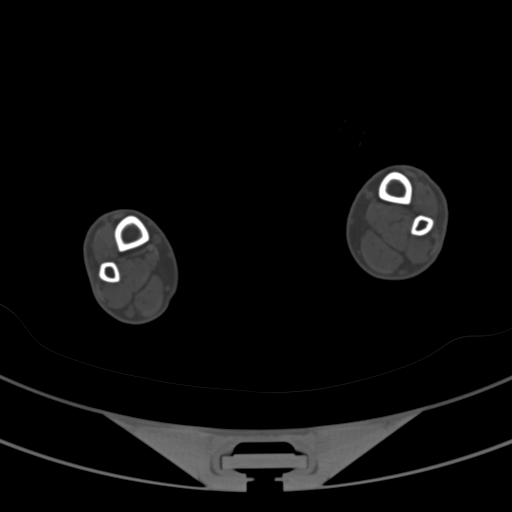
[im 72/78  bone]
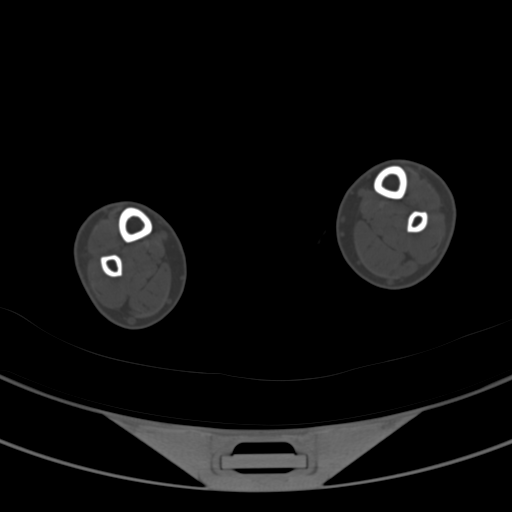

[Series 7: coronalsoft tissue · coronal · 0.46mm/px · 3 of 126 slices shown]
[im 26/126  bone]
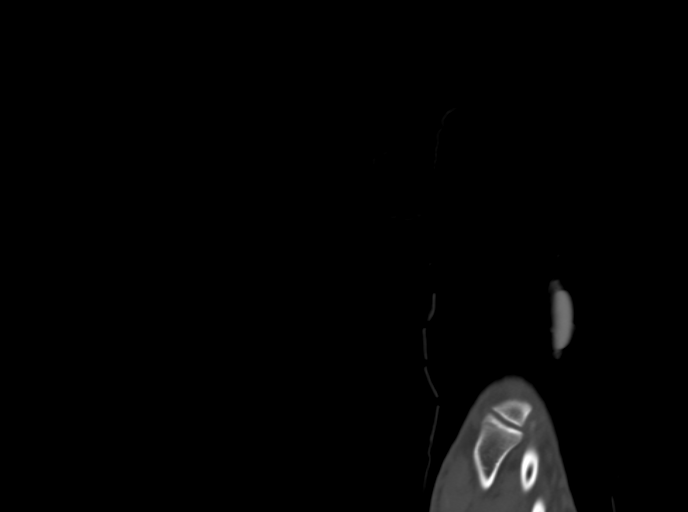
[im 51/126  bone]
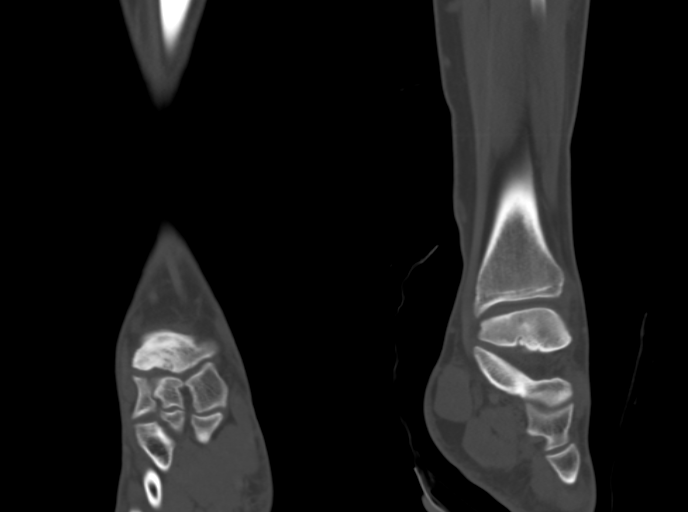
[im 76/126  bone]
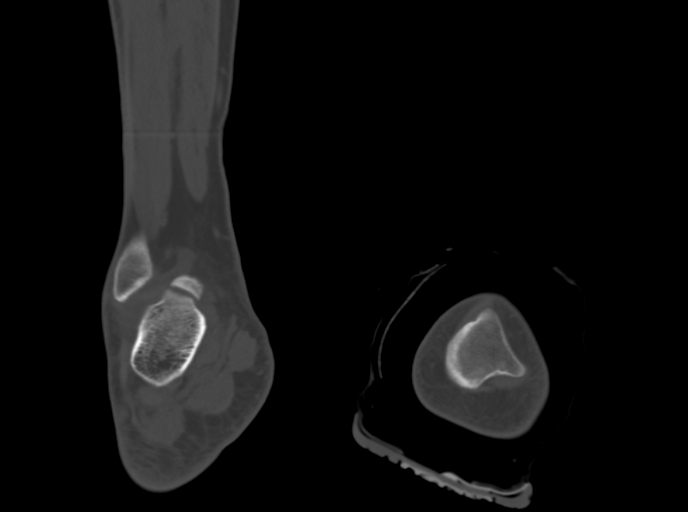

[Series 8: sagittalsoft tissue · sagittal · 0.46mm/px · 5 of 126 slices shown]
[im 32/126  bone]
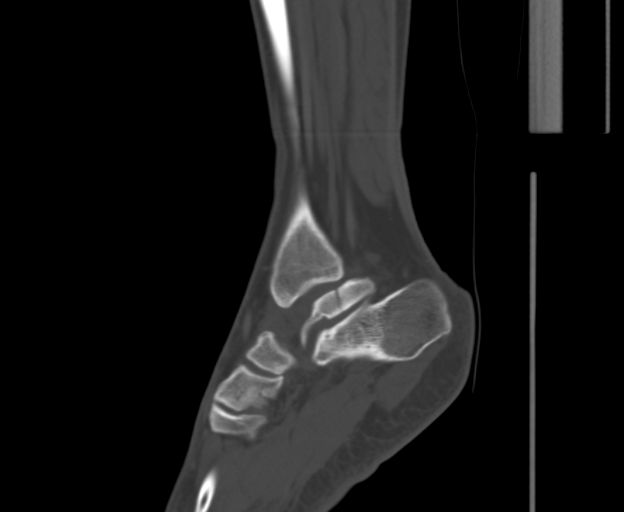
[im 47/126  bone]
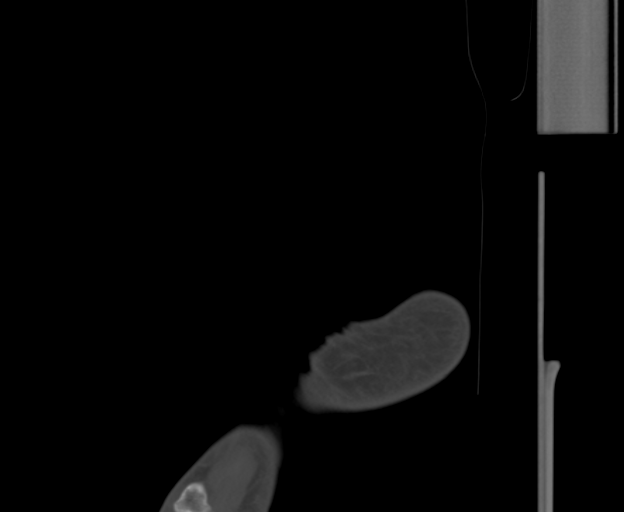
[im 63/126  bone]
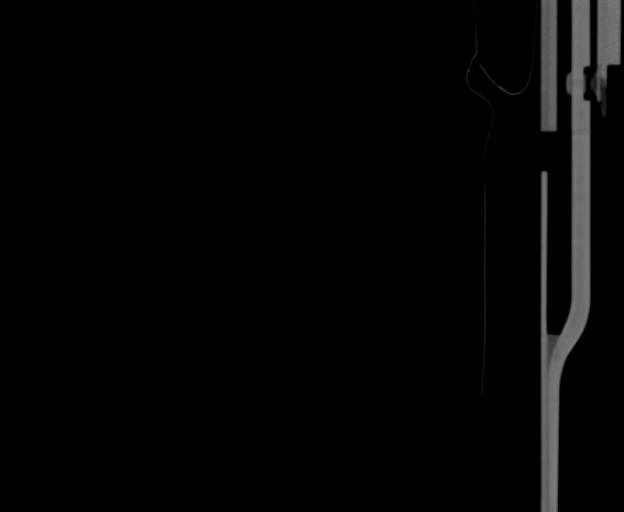
[im 79/126  bone]
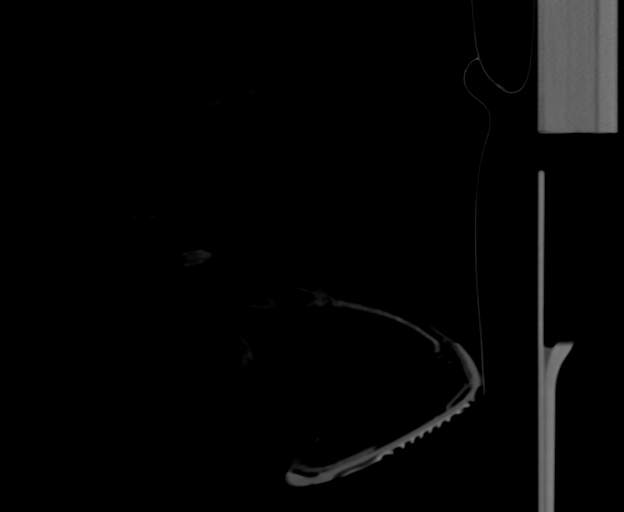
[im 94/126  bone]
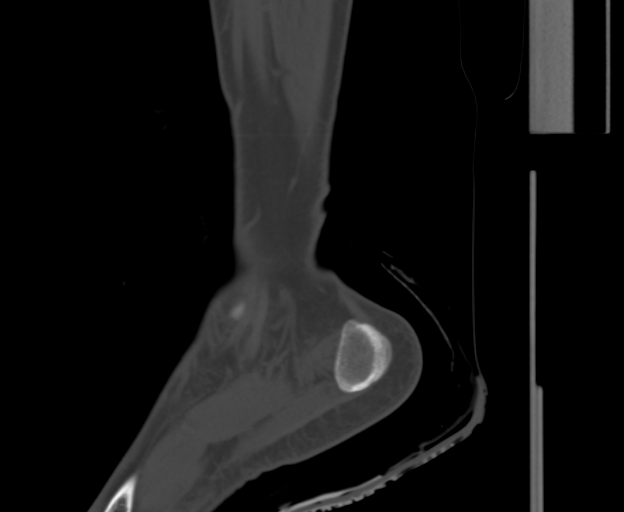

[15 of 33 positions shown; findings below may reference images not displayed]

FINDINGS: No acute fracture is identified. The patient has a dysplastic talus.
The dorsal process of the talus is markedly hypertrophied and
incompletely fused to the posterior margin of the talar dome. This
accounts for finding on plain film. The anterior tibia is intact.
The patient has a flat talar dome. The posterior middle facets of
the subtalar joint also appear mildly dysplastic, more notable in
the posterior facet. Small subchondral cyst in posterior talus at
the posterior facet is noted. The lateral 50% of the navicular bone
is markedly sclerotic and flattened likely secondary to avascular
necrosis. There is cystic change and flattening in the lateral half
of the navicular. No tibiotalar joint effusion is identified. No
notable soft tissue swelling about the ankle is seen. As visualized
by CT scan, ligaments and tendons appear intact.

The left ankle is imaged in the axial plane only and demonstrates
dysplastic appearing posterior and middle facets of the subtalar
joint. Prominent dorsal process of the calcaneus is identified.
IMPRESSION: Negative for acute fracture.

Dysplastic talus with a large dorsal process which is incompletely
fused. Flattening of the talar dome and mildly dysplastic subtalar
joint are also identified.

Sclerosis and flattening of the lateral aspect of the navicular bone
is most consistent with avascular necrosis.

## 2015-02-08 ENCOUNTER — Ambulatory Visit: Payer: Self-pay | Admitting: Internal Medicine

## 2015-02-18 ENCOUNTER — Encounter: Payer: Self-pay | Admitting: Internal Medicine

## 2015-02-18 ENCOUNTER — Ambulatory Visit (INDEPENDENT_AMBULATORY_CARE_PROVIDER_SITE_OTHER): Payer: Self-pay | Admitting: Internal Medicine

## 2015-02-18 VITALS — BP 136/90 | HR 72 | Ht 64.0 in | Wt 175.0 lb

## 2015-02-18 DIAGNOSIS — I10 Essential (primary) hypertension: Secondary | ICD-10-CM

## 2015-02-18 DIAGNOSIS — K029 Dental caries, unspecified: Secondary | ICD-10-CM

## 2015-02-18 MED ORDER — LISINOPRIL 5 MG PO TABS
5.0000 mg | ORAL_TABLET | Freq: Every day | ORAL | Status: DC
Start: 2015-02-18 — End: 2015-09-19

## 2015-02-18 NOTE — Patient Instructions (Addendum)
Can google "advance directives, [facility]"  And bring up form from [place]. Print and fill out Or can go to "5 wishes"  Which is also in Spanish and fill out--this costs $5--perhaps easier to use. Designate a CytogeneticistMedical Power of Attorney to speak for you if you are unable to speak for yourself when ill or injured  Drink a glass of water before every meal Drink 6-8 glasses of water daily Eat three meals daily Eat a protein and healthy fat with every meal (eggs,fish, chicken, [place]key and limit red meats) Eat 5 servings of vegetables daily, mix the colors Eat 2 servings of fruit daily with skin, if skin is edible Use smaller plates Put food/utensils down as you chew and swallow each bite Eat at a table with friends/family at least once daily, no TV Do not eat in front of the TV  Do not eat anything with MSG in it.

## 2015-02-18 NOTE — Progress Notes (Signed)
   Subjective:    Patient ID: Darryl Thomas, male    DOB: 08/12/1985, 29 y.o.   MRN: [id number]  Dental Pain   Headache  His past medical history is significant for hypertension.  Hypertension Associated symptoms include headaches.    1.  Elevated BP:  First noted in ED Visit in JUne of this year.  Maternal grandmother, mother and several from extended family with elevated bp.  States cares for his 555 yo daughter and also going to [employer] for [employer].  2.  Headaches:  States if eats pork, gets a headache.  Does think that it is often flavored with seasoning salt that may have MSG.  Pounding headache.  No photophobia. No nausea or vomiting with headaches.  States he has only had this problem for about 1 week.  Vision seems fine, except at night, cannot see as well.  3.  Has bad teeth.  Brushes teeth three times daily with [employer].  Also flosses three times daily.  Teeth breaking off.       Review of Systems  Neurological: Positive for headaches.       Objective:   Physical Exam  HEENT:  PERRL, EOMI, difficulty seeing discs well, but no exudates or hemorrhages noted.  TMs pearly gray.  Several caried broken teeth.  No significant swelling or erythema of gingiva. Neck:  Supple, no adenopathy, no thyromegaly Chest: CTA CV:  RRR with normal S1 and S2, no S3, S4, or murmur appreciated.  No LE edema Abd:  S, NT, No HSM or masses appreciated, +BS.  Large hypopigmented area of right mid abdominal skin.  Pt. 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  Gait normal        Assessment & Plan:  1.  Headaches:  Seem fleeting and possibly associated with MSG  2.  Essential Hypertension;  BUN and Crea were normal in June.  Start low dose Lisinopril.. Discussed side effects/benefits of treating.   Stronly urged to work on diet and physical activity for weight loss and for stress relief.  3.   Dental decay:  Referral to dental clinic with orange card.

## 2015-03-05 ENCOUNTER — Ambulatory Visit: Payer: Self-pay

## 2015-03-05 VITALS — BP 118/74 | HR 70 | Resp 16 | Ht 64.0 in | Wt 175.5 lb

## 2015-03-05 DIAGNOSIS — Z79899 Other long term (current) drug therapy: Secondary | ICD-10-CM

## 2015-03-06 LAB — BASIC METABOLIC PANEL
BUN/Creatinine Ratio: 17 (ref 8–19)
BUN: 16 mg/dL (ref 6–20)
CALCIUM: 9.1 mg/dL (ref 8.7–10.2)
CO2: 20 mmol/L (ref 18–29)
CREATININE: 0.96 mg/dL (ref 0.76–1.27)
Chloride: 104 mmol/L (ref 96–106)
GFR calc Af Amer: 125 mL/min/{1.73_m2} (ref >59)
GFR calc non Af Amer: 108 mL/min/{1.73_m2} (ref >59)
Glucose: 94 mg/dL (ref 65–99)
POTASSIUM: 4.3 mmol/L (ref 3.5–5.2)
Sodium: 141 mmol/L (ref 134–144)

## 2015-04-02 ENCOUNTER — Emergency Department (HOSPITAL_COMMUNITY)
Admission: EM | Admit: 2015-04-02 | Discharge: 2015-04-02 | Disposition: A | Discharge status: Home / Self Care | Payer: Self-pay | Attending: Emergency Medicine | Admitting: Emergency Medicine

## 2015-04-02 ENCOUNTER — Encounter (HOSPITAL_COMMUNITY): Payer: Self-pay

## 2015-04-02 DIAGNOSIS — I10 Essential (primary) hypertension: Secondary | ICD-10-CM | POA: Insufficient documentation

## 2015-04-02 DIAGNOSIS — Z79899 Other long term (current) drug therapy: Secondary | ICD-10-CM | POA: Insufficient documentation

## 2015-04-02 DIAGNOSIS — R42 Dizziness and giddiness: Secondary | ICD-10-CM | POA: Insufficient documentation

## 2015-04-02 DIAGNOSIS — F17210 Nicotine dependence, cigarettes, uncomplicated: Secondary | ICD-10-CM | POA: Insufficient documentation

## 2015-04-02 HISTORY — DX: Essential (primary) hypertension: I10

## 2015-04-02 MED ORDER — MECLIZINE HCL 25 MG PO TABS: 25.0000 mg | ORAL_TABLET | Freq: Three times a day (TID) | ORAL | Status: DC | PRN

## 2015-04-02 NOTE — ED Notes (Signed)
Patient states he was at work and was sitting when he became became dizzy. Patient stated he had a slight headache ,but it is now gone.

## 2015-04-02 NOTE — Discharge Instructions (Signed)
Make sure you take your blood pressure medicine daily. Prescription for dizziness.

## 2015-04-02 NOTE — ED Notes (Signed)
Patient refused EKG when attempting to perform.

## 2015-04-04 NOTE — ED Provider Notes (Signed)
CSN: [id number]     Arrival date & time 04/02/15  4098 History   First MD Initiated Contact with Patient 04/02/15 1306     Chief Complaint  Patient presents with  . Dizziness     (Consider location/radiation/quality/duration/timing/severity/associated sxs/prior Treatment) HPI .Marland Kitchen... Patient complains of dizziness earlier today at work. He is feeling better now. Past medical history hypertension. No prodromal illnesses. No stiff neck, neurological deficits. He states occasionally his blood pressure gets elevated.  Past Medical History  Diagnosis Date  . Hypertension    History reviewed. No pertinent past surgical history. Family History  Problem Relation Age of Onset  . Hypertension Mother   . Stroke Mother   . Hyperlipidemia Mother   . Hypertension Sister    Social History  Substance Use Topics  . Smoking status: Current Some Day Smoker -- 1.00 packs/day for 3 years    Types: Cigars  . Smokeless tobacco: Never Used  . Alcohol Use: No    Review of Systems  All other systems reviewed and are negative.     Allergies  Review of patient's allergies indicates no known allergies.  Home Medications   Prior to Admission medications   Medication Sig Start Date End Date Taking? Authorizing Provider  lisinopril (PRINIVIL,ZESTRIL) 5 MG tablet Take 1 tablet (5 mg total) by mouth daily. 02/18/15   Julieanne Manson, MD  meclizine (ANTIVERT) 25 MG tablet Take 1 tablet (25 mg total) by mouth 3 (three) times daily as needed for dizziness. 04/02/15   Donnetta Hutching, MD   BP 149/97 mmHg  Pulse 65  Temp(Src) 97.6 F (36.4 C) (Oral)  Resp 20  SpO2 97% Physical Exam  Constitutional: He is oriented to person, place, and time. He appears well-developed and well-nourished.  HENT:  Head: Normocephalic and atraumatic.  Eyes: Conjunctivae and EOM are normal. Pupils are equal, round, and reactive to light.  Neck: Normal range of motion. Neck supple.  Cardiovascular: Normal rate and regular  rhythm.   Pulmonary/Chest: Effort normal and breath sounds normal.  Abdominal: Soft. Bowel sounds are normal.  Musculoskeletal: Normal range of motion.  Neurological: He is alert and oriented to person, place, and time.  Skin: Skin is warm and dry.  Psychiatric: He has a normal mood and affect. His behavior is normal.  Nursing note and vitals reviewed.   ED Course  Procedures (including critical care time) Labs Review Labs Reviewed - No data to display  Imaging Review No results found. I have personally reviewed and evaluated these images and lab results as part of my medical decision-making.   EKG Interpretation None      MDM   Final diagnoses:  Vertigo    Patient has a normal physical exam. Blood pressure is minimally elevated. Patient desires to go home.    Donnetta Hutching, MD 04/04/15 [phone]

## 2015-04-22 ENCOUNTER — Ambulatory Visit (INDEPENDENT_AMBULATORY_CARE_PROVIDER_SITE_OTHER): Payer: Self-pay | Admitting: Internal Medicine

## 2015-04-22 ENCOUNTER — Encounter: Payer: Self-pay | Admitting: Internal Medicine

## 2015-04-22 VITALS — BP 130/86 | HR 82 | Ht 64.0 in | Wt 172.0 lb

## 2015-04-22 DIAGNOSIS — I10 Essential (primary) hypertension: Secondary | ICD-10-CM

## 2015-04-22 DIAGNOSIS — Z23 Encounter for immunization: Secondary | ICD-10-CM

## 2015-04-22 DIAGNOSIS — K029 Dental caries, unspecified: Secondary | ICD-10-CM

## 2015-04-22 MED ORDER — PENICILLIN V POTASSIUM 250 MG PO TABS: ORAL_TABLET | ORAL | Status: DC

## 2015-04-22 NOTE — Progress Notes (Signed)
   Subjective:    Patient ID: Darryl Thomas, male    DOB: 07-26-1985, 30 y.o.   MRN: [id number]  HPI   1.  Hypertension:  Tolerating Lisinopril fine.  Takes in the evening every day.  Did miss medication yesterday.  2.  Vertigo:  Was seen in ED for this 03/01/2016.  Has very mild episodes since then, but gradually decreasing.  Does not recall URI symptoms preceding the vertigo.  States that any movement set off the dizziness. Since then, however, awakens with a  Headache.  Pt. Does have several teeth that hurt.  No pus or swollen gums.  Has been having teeth break off and then when air or food gets on them, develops terrible pain and headache.  Does state the headaches he has in the morning are the same as the ones when his teeth really hurt.   Was referred to dentist 02/18/2015, but do not see the paperwork    Review of Systems     Objective:   Physical Exam HEENT:  PERRL, EOMI, discs sharp. Several teeth missing and teeth with severe, deep cavities.  No definite erythema of gingiva or swelling.  Throat without injection TMs pearly gray Neck:  Supple, no adenopathy Chest:  CTA CV:  RRR without murmur or rub.  Normal S1 and S2, no S3 or S4.  Carotid, radial pulses normal and equal.      Assessment & Plan:  1.  Essential hypertension:  Controlled.  Asked they get started on dietary changes and physical activity.  Information given.  Would like them to implement one activity per week.  2.  Headaches and dental pain:  Start Pen V K 250 mg 4 times daily for 7 days.  Try to get him in for visit with dentist soon.  Not clear what hold up is.  Rereferred with an urgent referral.

## 2015-04-22 NOTE — Patient Instructions (Addendum)
Call if you do not hear from the dentist before the end of FEbruary  Drink a glass of water before every meal Drink 6-8 glasses of water daily Eat three meals daily Eat a protein and healthy fat with every meal (eggs,fish, chicken, [place] and limit red meats) Eat 5 servings of vegetables daily, mix the colors Eat 2 servings of fruit daily with skin, if skin is edible Use smaller plates Put food/utensils down as you chew and swallow each bite Eat at a table with friends/family at least once daily, no TV Do not eat in front of the TV

## 2015-08-19 ENCOUNTER — Ambulatory Visit: Payer: Self-pay | Admitting: Internal Medicine

## 2015-09-19 ENCOUNTER — Ambulatory Visit (INDEPENDENT_AMBULATORY_CARE_PROVIDER_SITE_OTHER): Payer: Self-pay | Admitting: Internal Medicine

## 2015-09-19 ENCOUNTER — Encounter: Payer: Self-pay | Admitting: Internal Medicine

## 2015-09-19 VITALS — BP 130/82 | HR 78 | Resp 18 | Ht 62.5 in | Wt 170.0 lb

## 2015-09-19 DIAGNOSIS — H539 Unspecified visual disturbance: Secondary | ICD-10-CM

## 2015-09-19 DIAGNOSIS — I10 Essential (primary) hypertension: Secondary | ICD-10-CM | POA: Insufficient documentation

## 2015-09-19 NOTE — Patient Instructions (Signed)
Call if you do not hear about eye doctor appt. In 1 month

## 2015-09-19 NOTE — Progress Notes (Signed)
   Subjective:    Patient ID: Darryl Thomas, male    DOB: 07/29/1985, 30 y.o.   MRN: [id number]  HPI   1.  Essential Hypertension:  Stopped taking Lisinopril in February. States his wife, Albertha GheeShameka Wilson, (just married in April) has changed the contents of his fridge.  He is now eating only baked foods and healthy foods--lots of fruits and veggies.  Is being physically active every day. Discussed these lifestyle changes are his treatment hypertension.    2.  Blurry vision at night time. Feels this has been a problem for about 2 months. Only a problem at night. No irritation/pain of eyes.  Needs to see eye doctor.  Does have orange card.      Review of Systems     Objective:   Physical Exam NAD HEENT:  PERRL, EOMI, discs sharp, retinal exudates or hemorrhages,  Visual fields full to confrontation,  No injection of eyes, throat without injection Neck:  Supple, no adenopathy Chest:  CTA CV:  RRR without murmur or rub, radial pulses normal and equal.  20/25 vision, right eye 20/20 vision, left eye 20/15 vision bilaterally     Assessment & Plan:  1.  Essential Hypertension:  BP is fine after being off Lisinopril  For 4 months.  Describes significant lifestyle changes.  No medication for now. Check every 6 months to see if keeping up with lifestyle changes and bp control  2.  Decreased night vision:  Referral to optometry.  No concerning findings on exam

## 2016-03-23 ENCOUNTER — Ambulatory Visit: Payer: Self-pay | Admitting: Internal Medicine

## 2016-04-28 ENCOUNTER — Ambulatory Visit: Payer: Self-pay | Admitting: Internal Medicine

## 2016-07-20 ENCOUNTER — Encounter (HOSPITAL_COMMUNITY): Payer: Self-pay | Admitting: Emergency Medicine

## 2016-07-20 ENCOUNTER — Emergency Department (HOSPITAL_COMMUNITY)
Admission: EM | Admit: 2016-07-20 | Discharge: 2016-07-20 | Disposition: A | Discharge status: Home / Self Care | Payer: Self-pay | Attending: Emergency Medicine | Admitting: Emergency Medicine

## 2016-07-20 DIAGNOSIS — R0981 Nasal congestion: Secondary | ICD-10-CM | POA: Insufficient documentation

## 2016-07-20 DIAGNOSIS — Z87891 Personal history of nicotine dependence: Secondary | ICD-10-CM | POA: Insufficient documentation

## 2016-07-20 DIAGNOSIS — I10 Essential (primary) hypertension: Secondary | ICD-10-CM | POA: Insufficient documentation

## 2016-07-20 DIAGNOSIS — R52 Pain, unspecified: Secondary | ICD-10-CM | POA: Insufficient documentation

## 2016-07-20 MED ORDER — FLUTICASONE PROPIONATE 50 MCG/ACT NA SUSP: 1.0000 | Freq: Every day | NASAL | 2 refills | Status: DC

## 2016-07-20 MED ORDER — CETIRIZINE-PSEUDOEPHEDRINE ER 5-120 MG PO TB12: 1.0000 | ORAL_TABLET | Freq: Two times a day (BID) | ORAL | 0 refills | Status: AC

## 2016-07-20 NOTE — Discharge Instructions (Signed)
Medications: Zyrtec-D, Flonase  Treatment: Take Zyrtec-the twice daily for your nasal congestion and allergy symptoms. Take Flonase once daily in each nostril. You can continue taking Robitussin at home as prescribed over-the-counter. You can take Advil or Tylenol as prescribed over-the-counter for your body aches and fevers.  Follow-up: Please follow-up with your primary care provider if your symptoms are not improving over the next 7-10 days. These return to the emergency department if you develop any new or worsening symptoms including difficulty breathing, persistent fever over 100.4, or any other new or concerning symptoms.

## 2016-07-20 NOTE — ED Provider Notes (Signed)
[facility]-EMERGENCY DEPT Provider Note   CSN: [id number] Arrival date & time: 07/20/16  [id number]   By signing my name below, I, Soijett Blue, attest that this documentation has been prepared under the direction and in the presence of Buel ReamAlexandra Milford Cilento, PA-C Electronically Signed: Soijett Blue, ED Scribe. 07/20/16. 10:32 AM.  History   Chief Complaint Chief Complaint  Patient presents with  . Fever  . Generalized Body Aches    HPI Darryl Thomas is a 31 y.o. male with a PMHx of HTN, who presents to the Emergency Department complaining of subjective fever onset 2 days ago. Pt reports associated chills, dry cough, generalized body aches, nasal congestion, and sore throat due to cough. Pt has tried robitussin, sudafed, and benadryl without significant relief of his symptoms. Pt notes that he has a hx of seasonal allergies. He denies abdominal pain, nausea, vomiting, ear pain, trouble swallowing, and any other symptoms.   The history is provided by the patient and a significant other. No language interpreter was used.    Past Medical History:  Diagnosis Date  . Hypertension     Patient Active Problem List   Diagnosis Date Noted  . Essential hypertension 09/19/2015    History reviewed. No pertinent surgical history.     Home Medications    Prior to Admission medications   Not on File    Family History Family History  Problem Relation Age of Onset  . Hypertension Mother   . Stroke Mother   . Hyperlipidemia Mother   . Hypertension Sister     Social History Social History  Substance Use Topics  . Smoking status: Former Smoker    Packs/day: 1.00    Years: 3.00    Types: Cigars    Quit date: 06/29/2015  . Smokeless tobacco: Never Used  . Alcohol use No     Allergies   Patient has no known allergies.   Review of Systems Review of Systems  Constitutional: Positive for chills and fever (subjective).  HENT: Positive for congestion and sore throat (due to cough). Negative  for ear pain and trouble swallowing.   Respiratory: Positive for cough (dry).   Gastrointestinal: Negative for abdominal pain, nausea and vomiting.  Musculoskeletal: Positive for myalgias.     Physical Exam Updated Vital Signs BP 109/84 (BP Location: Left Arm)   Pulse 87   Temp 98.3 F (36.8 C) (Oral)   Ht 5\' 4"  (1.626 m)   Wt 166 lb (75.3 kg)   SpO2 98%   BMI 28.49 kg/m   Physical Exam  Constitutional: He appears well-developed and well-nourished. No distress.  HENT:  Head: Normocephalic and atraumatic.  Nose: Right sinus exhibits maxillary sinus tenderness and frontal sinus tenderness. Left sinus exhibits maxillary sinus tenderness and frontal sinus tenderness.  Mouth/Throat: Uvula is midline and mucous membranes are normal. Posterior oropharyngeal erythema present. No oropharyngeal exudate.  Posterior oropharynx is mildly erythematous.   Eyes: Conjunctivae are normal. Pupils are equal, round, and reactive to light. Right eye exhibits no discharge. Left eye exhibits no discharge. No scleral icterus.  Neck: Normal range of motion. Neck supple. No thyromegaly present.  Cardiovascular: Normal rate, regular rhythm, normal heart sounds and intact distal pulses.  Exam reveals no gallop and no friction rub.   No murmur heard. Pulmonary/Chest: Effort normal and breath sounds normal. No stridor. No respiratory distress. He has no wheezes. He has no rales.  Abdominal: Soft. Bowel sounds are normal. He exhibits no distension. There is no tenderness. There is  no rebound and no guarding.  Musculoskeletal: He exhibits no edema.  Lymphadenopathy:    He has no cervical adenopathy.  Neurological: He is alert. Coordination normal.  Skin: Skin is warm and dry. No rash noted. He is not diaphoretic. No pallor.  Psychiatric: He has a normal mood and affect.  Nursing note and vitals reviewed.    ED Treatments / Results  DIAGNOSTIC STUDIES: Oxygen Saturation is 98% on RA, nl by my  interpretation.    COORDINATION OF CARE: 10:13 AM Discussed treatment plan with pt at bedside which includes flonase, zyrtec-d and pt agreed to plan.   Procedures Procedures (including critical care time)  Medications Ordered in ED Medications - No data to display   Initial Impression / Assessment and Plan / ED Course  I have reviewed the triage vital signs and the nursing notes.       Pt symptoms consistent with allergic rhinitis versus viral syndrome. Patient is well-appearing. No CXR indicated at this time due to lungs being clear to ausculation bilaterally. Doubt strep due to only mild sore throat and significant nasal congestion. Exam is not convincing for strep throat. Pt will be discharged with Flonase and Zyrtec-D prescriptions.  Discussed return precautions.  Pt is hemodynamically stable & in NAD prior to discharge.  Final Clinical Impressions(s) / ED Diagnoses   Final diagnoses:  Nasal congestion  Body aches    New Prescriptions New Prescriptions   No medications on file   I personally performed the services described in this documentation, which was scribed in my presence. The recorded information has been reviewed and is accurate.     Emi Holes, PA-C 07/20/16 1118    Maia Plan, MD 07/20/16 [phone]

## 2016-07-20 NOTE — ED Triage Notes (Signed)
Patient c/o fevers, body aches, chills over the weekend. Patient has checked his temperature just states that he was hot.

## 2016-07-20 NOTE — ED Notes (Signed)
Bed: WTR8 Expected date:  Expected time:  Means of arrival:  Comments: 

## 2017-11-18 ENCOUNTER — Emergency Department (HOSPITAL_COMMUNITY): Payer: Self-pay

## 2017-11-18 ENCOUNTER — Encounter (HOSPITAL_COMMUNITY): Payer: Self-pay

## 2017-11-18 ENCOUNTER — Emergency Department (HOSPITAL_BASED_OUTPATIENT_CLINIC_OR_DEPARTMENT_OTHER): Payer: Self-pay

## 2017-11-18 ENCOUNTER — Other Ambulatory Visit: Payer: Self-pay

## 2017-11-18 ENCOUNTER — Emergency Department (HOSPITAL_COMMUNITY)
Admission: EM | Admit: 2017-11-18 | Discharge: 2017-11-18 | Disposition: A | Discharge status: Home / Self Care | Payer: Self-pay | Attending: Emergency Medicine | Admitting: Emergency Medicine

## 2017-11-18 DIAGNOSIS — F17290 Nicotine dependence, other tobacco product, uncomplicated: Secondary | ICD-10-CM | POA: Insufficient documentation

## 2017-11-18 DIAGNOSIS — M79609 Pain in unspecified limb: Secondary | ICD-10-CM

## 2017-11-18 DIAGNOSIS — I10 Essential (primary) hypertension: Secondary | ICD-10-CM | POA: Insufficient documentation

## 2017-11-18 DIAGNOSIS — M8700 Idiopathic aseptic necrosis of unspecified bone: Secondary | ICD-10-CM | POA: Insufficient documentation

## 2017-11-18 DIAGNOSIS — R600 Localized edema: Secondary | ICD-10-CM | POA: Insufficient documentation

## 2017-11-18 DIAGNOSIS — Z79899 Other long term (current) drug therapy: Secondary | ICD-10-CM | POA: Insufficient documentation

## 2017-11-18 MED ORDER — NAPROXEN 500 MG PO TABS: 500.0000 mg | ORAL_TABLET | Freq: Two times a day (BID) | ORAL | 0 refills | Status: AC

## 2017-11-18 MED ORDER — HYDROCODONE-ACETAMINOPHEN 5-325 MG PO TABS
1.0000 | ORAL_TABLET | Freq: Once | ORAL | Status: AC
  Administered 2017-11-18: 1 via ORAL
  Filled 2017-11-18: qty 1

## 2017-11-18 NOTE — Discharge Instructions (Signed)
The ultrasound done of your left lower extremity was negative for a blood clot.  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 You will need to follow-up with an orthopedic doctor for further management of this. In the meantime, take pain medication as prescribed.  Wear the Ace wrap as directed. Return to ED for worsening symptoms, numbness in your legs, red hot or tender joint, fevers.

## 2017-11-18 NOTE — ED Triage Notes (Signed)
Pt endorses intermittent left foot pain/swelling "since I was 16" that comes and goes. VSS

## 2017-11-18 NOTE — Progress Notes (Signed)
Preliminary notes--Left lower extremity venous duplex exam completed. Negative for DVT. Hongying Otto Caraway (RDMS RVT) 11/18/17 3:02 PM

## 2017-11-18 NOTE — ED Triage Notes (Signed)
PT called from front lobby no answer

## 2017-11-18 NOTE — ED Provider Notes (Signed)
[facility] EMERGENCY DEPARTMENT Provider Note   CSN: [id number] Arrival date & time: 11/18/17  4098     History   Chief Complaint Chief Complaint  Patient presents with  . Foot Pain    HPI Darryl Thomas is a 32 y.o. male with a past medical history of hypertension, who presents to ED for evaluation of left foot pain and swelling that began this morning.  States that he has had intermittent symptoms similarly for the past 16 years.  He is unsure what initially caused his symptoms 16 years ago.  He states that the swelling and "tightness" has never been this bad.  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  He denies history of DVT, recent surgeries or hormone use.  Denies any known injury.  Denies any numbness in the foot, history of gout, fevers or history of septic joint.  HPI  Past Medical History:  Diagnosis Date  . Hypertension     Patient Active Problem List   Diagnosis Date Noted  . Essential hypertension 09/19/2015    History reviewed. No pertinent surgical history.      Home Medications    Prior to Admission medications   Medication Sig Start Date End Date Taking? Authorizing Provider  cetirizine-pseudoephedrine (ZYRTEC-D) 5-120 MG tablet Take 1 tablet by mouth 2 (two) times daily. 07/20/16   Law, Waylan Boga, PA-C  fluticasone (FLONASE) 50 MCG/ACT nasal spray Place 1 spray into both nostrils daily. 07/20/16   Law, Waylan Boga, PA-C  naproxen (NAPROSYN) 500 MG tablet Take 1 tablet (500 mg total) by mouth 2 (two) times daily. 11/18/17   Dietrich Pates, PA-C    Family History Family History  Problem Relation Age of Onset  . Hypertension Mother   . Stroke Mother   . Hyperlipidemia Mother   . Hypertension Sister     Social History Social History   Tobacco Use  . Smoking status: Former Smoker    Packs/day: 1.00    Years: 3.00    Pack years: 3.00    Types: Cigars    Last attempt to quit: 06/29/2015     Years since quitting: 2.3  . Smokeless tobacco: Never Used  Substance Use Topics  . Alcohol use: No    Alcohol/week: 0.0 standard drinks  . Drug use: No     Allergies   Patient has no known allergies.   Review of Systems Review of Systems  Constitutional: Negative for chills and fever.  Musculoskeletal: Positive for arthralgias and myalgias.  Skin: Positive for color change.  Neurological: Negative for weakness and numbness.     Physical Exam Updated Vital Signs BP (!) 130/93 (BP Location: Right Arm)   Pulse 69   Temp 98 F (36.7 C) (Oral)   Resp 16   Ht 5\' 5"  (1.651 m)   Wt 75.8 kg   SpO2 100%   BMI 27.79 kg/m   Physical Exam  Constitutional: He appears well-developed and well-nourished. No distress.  HENT:  Head: Normocephalic and atraumatic.  Eyes: Conjunctivae and EOM are normal. No scleral icterus.  Neck: Normal range of motion.  Pulmonary/Chest: Effort normal. No respiratory distress.  Musculoskeletal: He exhibits edema and tenderness.  Diffuse edema and tenderness to palpation of the midfoot of the left foot.  No overlying erythema or warmth of joint noted.  Full active and passive range of motion of digits without difficulty.  2+ DP pulse palpated.  Neurological: He  is alert.  Skin: No rash noted. He is not diaphoretic.  Psychiatric: He has a normal mood and affect.  Nursing note and vitals reviewed.    ED Treatments / Results  Labs (all labs ordered are listed, but only abnormal results are displayed) Labs Reviewed - No data to display  EKG None  Radiology Dg Foot Complete Left  Result Date: 11/18/2017 CLINICAL DATA:  Left foot pain with swelling. Pain with bearing weight. NKI. Pt said he has sprained his ankle before. No surgery to left foot. EXAM: LEFT FOOT - COMPLETE 3+ VIEW COMPARISON:  None. FINDINGS: No fracture. The navicular is narrowed laterally with sclerosis and cystic change consistent with chronic avascular necrosis. This is  similar to the appearance of the right navicular on a CT dated 08/22/2013. There is a bony prominence from the dorsal aspect of the talus bordering the posterior aspect of the posterior facet of the subtalar joint. This may reflect fusion of an os trigonum to the calcaneus. Remaining joints are normally spaced and aligned. No arthropathic change. Mild medial midfoot soft tissue swelling is noted. IMPRESSION: 1. No fracture or acute finding. 2. Abnormal morphology of the navicular similar in appearance to that seen of the right navicular on the prior right ankle CT scan. 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 3. Abnormal appearance of the dorsal aspect of the calcaneus. 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 4. Mild medial midfoot soft tissue swelling. Electronically Signed   By: Amie Portlandavid  Ormond M.D.   On: 11/18/2017 10:52   Preliminary notes--Left lower extremity venous duplex exam completed. Negative for DVT. Hongying Richardson DoppCole (RDMS RVT) 11/18/17 3:02 PM  Procedures Procedures (including critical care time)  Medications Ordered in ED Medications  HYDROcodone-acetaminophen (NORCO/VICODIN) 5-325 MG per tablet 1 tablet (1 tablet Oral Given 11/18/17 1317)     Initial Impression / Assessment and Plan / ED Course  I have reviewed the triage vital signs and the nursing notes.  Pertinent labs & imaging results that were available during my care of the patient were reviewed by me and considered in my medical decision making (see chart for details).     32yo male with a past medical history of hypertension presents for left foot pain and swelling that began this morning.  He has had this problem intermittently for the past 16 years.  States that the swelling and tightness has been worse this time.  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  Denies history of DVT, recent surgeries or hormone use.  Denies any known injury.  On exam there is swelling of the foot  with diffuse tenderness to palpation.  X-ray of the foot shows chronic avascular necrosis as noted in the CTs unchanged since 2015.  No acute abnormality noted.  DVT study is negative.  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  States that he has been treated in the past with anti-inflammatories which has helped.  Will give naproxen, Ace wrap and orthopedic follow-up.  Doubt infectious cause of symptoms.  He would like to be referred to a new orthopedist.  Advised him to return to ED for any severe worsening symptoms.  Portions of this note were generated with Scientist, clinical (histocompatibility and immunogenetics)Dragon dictation software. Dictation errors may occur despite best attempts at proofreading.  Final Clinical Impressions(s) / ED Diagnoses   Final diagnoses:  Avascular necrosis of bone [facility](HCC)    ED Discharge Orders  Ordered    naproxen (NAPROSYN) 500 MG tablet  2 times daily     11/18/17 1533           Dietrich Pates, PA-C 11/18/17 1714    Alvira Monday, MD 11/18/17 2107

## 2019-04-10 ENCOUNTER — Ambulatory Visit: Payer: Self-pay | Attending: Internal Medicine

## 2019-04-10 DIAGNOSIS — Z20822 Contact with and (suspected) exposure to covid-19: Secondary | ICD-10-CM

## 2019-04-10 DIAGNOSIS — U071 COVID-19: Secondary | ICD-10-CM | POA: Insufficient documentation

## 2019-04-11 ENCOUNTER — Telehealth: Payer: Self-pay

## 2019-04-11 LAB — NOVEL CORONAVIRUS, NAA: SARS-CoV-2, NAA: DETECTED — AB

## 2019-04-11 NOTE — Telephone Encounter (Signed)
Pt. Called back, wanted to review his COVID 19 results and quarantine requirements. Verbalizes understanding.

## 2019-04-24 ENCOUNTER — Ambulatory Visit (HOSPITAL_COMMUNITY)
Admission: EM | Admit: 2019-04-24 | Discharge: 2019-04-24 | Disposition: A | Discharge status: Home / Self Care | Payer: Self-pay | Attending: Family Medicine | Admitting: Family Medicine

## 2019-04-24 ENCOUNTER — Ambulatory Visit: Payer: Self-pay | Attending: Internal Medicine

## 2019-04-24 ENCOUNTER — Encounter (HOSPITAL_COMMUNITY): Payer: Self-pay

## 2019-04-24 ENCOUNTER — Other Ambulatory Visit: Payer: Self-pay

## 2019-04-24 DIAGNOSIS — U071 COVID-19: Secondary | ICD-10-CM

## 2019-04-24 DIAGNOSIS — Z20822 Contact with and (suspected) exposure to covid-19: Secondary | ICD-10-CM | POA: Insufficient documentation

## 2019-04-24 NOTE — ED Provider Notes (Signed)
[facility]   [id number] 04/24/19 Arrival Time: 6761  ASSESSMENT & PLAN:  1. COVID-19 virus infection     Feeling well. See letter in file stating that he may return to work.   Follow-up Information    Julieanne Manson, MD.   Specialty: Internal Medicine Why: As needed. Contact information: [address] [place] [address] [phone]           Reviewed expectations re: course of current medical issues. Questions answered. Outlined signs and symptoms indicating need for more acute intervention. Understanding verbalized. After Visit Summary given.   SUBJECTIVE: History from: patient. Hicks EDSEL SHIVES is a 34 y.o. male who tested pos for COVID on 04/11/19. Feeling better now. 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 Denies: runny nose, congestion, fever, cough, sore throat, difficulty breathing and headache. Normal PO intake without n/v/d.    OBJECTIVE:  Vitals:   04/24/19 0911  BP: 132/81  Pulse: 86  Resp: 17  Temp: 98.5 F (36.9 C)  TempSrc: Oral  SpO2: 98%    General appearance: alert; no distress Eyes: PERRLA; EOMI; conjunctiva normal HENT: [place]; AT; nasal mucosa normal; oral mucosa normal Neck: supple  Lungs: speaks full sentences without difficulty; unlabored Extremities: no edema Skin: warm and dry Neurologic: normal gait Psychological: alert and cooperative; normal mood and affect    No Known Allergies  Past Medical History:  Diagnosis Date  . Hypertension    Social History   Socioeconomic History  . Marital status: Married    Spouse name: Not on file  . Number of children: Not on file  . Years of education: Not on file  . Highest education level: Not on file  Occupational History  . Not on file  Tobacco Use  . Smoking status: Former Smoker    Packs/day: 1.00    Years: 3.00    Pack years: 3.00    Types: Cigars    Quit date: 06/29/2015    Years since quitting: 3.8  . Smokeless tobacco: Never Used    Substance and Sexual Activity  . Alcohol use: No    Alcohol/week: 0.0 standard drinks  . Drug use: No  . Sexual activity: Yes    Partners: Female    Birth control/protection: Implant    Comment: Thinks GF has implanon  Other Topics Concern  . Not on file  Social History Narrative  . Not on file   Social Determinants of Health   Financial Resource Strain:   . Difficulty of Paying Living Expenses: Not on file  Food Insecurity:   . Worried About Programme researcher, broadcasting/film/video in the Last Year: Not on file  . Ran Out of Food in the Last Year: Not on file  Transportation Needs:   . Lack of Transportation (Medical): Not on file  . Lack of Transportation (Non-Medical): Not on file  Physical Activity:   . Days of Exercise per Week: Not on file  . Minutes of Exercise per Session: Not on file  Stress:   . Feeling of Stress : Not on file  Social Connections:   . Frequency of Communication with Friends and Family: Not on file  . Frequency of Social Gatherings with Friends and Family: Not on file  . Attends Religious Services: Not on file  . Active Member of Clubs or Organizations: Not on file  . Attends Banker Meetings: Not on file  . Marital Status: Not on file  Intimate Partner Violence:   .  Fear of Current or Ex-Partner: Not on file  . Emotionally Abused: Not on file  . Physically Abused: Not on file  . Sexually Abused: Not on file   Family History  Problem Relation Age of Onset  . Hypertension Mother   . Stroke Mother   . Hyperlipidemia Mother   . Hypertension Sister    History reviewed. No pertinent surgical history.   Vanessa Kick, MD 04/24/19 0930

## 2019-04-24 NOTE — ED Triage Notes (Signed)
Pt presents for work note post covid 04/11/2019; pt states he is feeling all better.

## 2019-04-25 LAB — NOVEL CORONAVIRUS, NAA: SARS-CoV-2, NAA: NOT DETECTED

## 2022-11-03 ENCOUNTER — Encounter (HOSPITAL_COMMUNITY): Payer: Self-pay | Admitting: Emergency Medicine

## 2022-11-03 ENCOUNTER — Ambulatory Visit (HOSPITAL_COMMUNITY)
Admission: EM | Admit: 2022-11-03 | Discharge: 2022-11-03 | Disposition: A | Discharge status: Home / Self Care | Payer: Self-pay | Attending: Physician Assistant | Admitting: Physician Assistant

## 2022-11-03 DIAGNOSIS — J069 Acute upper respiratory infection, unspecified: Secondary | ICD-10-CM

## 2022-11-03 DIAGNOSIS — U071 COVID-19: Secondary | ICD-10-CM | POA: Insufficient documentation

## 2022-11-03 MED ORDER — PROMETHAZINE-DM 6.25-15 MG/5ML PO SYRP: 5.0000 mL | ORAL_SOLUTION | Freq: Four times a day (QID) | ORAL | 0 refills | Status: AC | PRN

## 2022-11-03 MED ORDER — FLUTICASONE PROPIONATE 50 MCG/ACT NA SUSP: 1.0000 | Freq: Every day | NASAL | 0 refills | Status: AC

## 2022-11-03 NOTE — Discharge Instructions (Addendum)
Recommend Tylenol or Ibuprofen as needed for headeache and fever Can take Mucinex and use Flonase for congestion Can use cough syrup as needed for cough.  Recommend rest, drink plenty of fluids Will call with test results.

## 2022-11-03 NOTE — ED Triage Notes (Signed)
Pt reports body ache, back pains, headache, chills, no taste or smell that started today while at work. Hasn't taken anything for his symptoms.

## 2022-11-03 NOTE — ED Provider Notes (Signed)
[facility]    CSN: [id number] Arrival date & time: 11/03/22  1621      History   Chief Complaint Chief Complaint  Patient presents with   Back Pain   Generalized Body Aches    HPI Darryl Thomas is a 37 y.o. male.   Pt complains of body aches, headaches, congestion that started earlier today.  He denies shortness of breath or wheezing, nausea, vomiting.  He is taking nothing for his symptoms.  Patient left work today due to symptoms.  He has not taken a home COVID test.    Past Medical History:  Diagnosis Date   Hypertension     Patient Active Problem List   Diagnosis Date Noted   Essential hypertension 09/19/2015    History reviewed. No pertinent surgical history.     Home Medications    Prior to Admission medications   Medication Sig Start Date End Date Taking? Authorizing Provider  fluticasone (FLONASE) 50 MCG/ACT nasal spray Place 1 spray into both nostrils daily. 11/03/22  Yes Ward, Tylene Fantasia, PA-C  promethazine-dextromethorphan (PROMETHAZINE-DM) 6.25-15 MG/5ML syrup Take 5 mLs by mouth 4 (four) times daily as needed for cough. 11/03/22  Yes Ward, Tylene Fantasia, PA-C  cetirizine-pseudoephedrine (ZYRTEC-D) 5-120 MG tablet Take 1 tablet by mouth 2 (two) times daily. 07/20/16   Law, Waylan Boga, PA-C  naproxen (NAPROSYN) 500 MG tablet Take 1 tablet (500 mg total) by mouth 2 (two) times daily. 11/18/17   Dietrich Pates, PA-C    Family History Family History  Problem Relation Age of Onset   Hypertension Mother    Stroke Mother    Hyperlipidemia Mother    Hypertension Sister     Social History Social History   Tobacco Use   Smoking status: Former    Current packs/day: 0.00    Average packs/day: 1 pack/day for 3.0 years (3.0 ttl pk-yrs)    Types: Cigars, Cigarettes    Start date: 06/28/2012    Quit date: 06/29/2015    Years since quitting: 7.3   Smokeless tobacco: Never  Vaping Use   Vaping status: Never Used  Substance Use Topics   Alcohol  use: No    Alcohol/week: 0.0 standard drinks of alcohol   Drug use: No     Allergies   Patient has no known allergies.   Review of Systems Review of Systems  Constitutional:  Negative for chills and fever.  HENT:  Positive for congestion. Negative for ear pain and sore throat.   Eyes:  Negative for pain and visual disturbance.  Respiratory:  Positive for cough. Negative for shortness of breath.   Cardiovascular:  Negative for chest pain and palpitations.  Gastrointestinal:  Negative for abdominal pain and vomiting.  Genitourinary:  Negative for dysuria and hematuria.  Musculoskeletal:  Positive for myalgias. Negative for arthralgias and back pain.  Skin:  Negative for color change and rash.  Neurological:  Negative for seizures and syncope.  All other systems reviewed and are negative.    Physical Exam Triage Vital Signs ED Triage Vitals  Encounter Vitals Group     BP 11/03/22 1727 128/87     Systolic BP Percentile --      Diastolic BP Percentile --      Pulse Rate 11/03/22 1727 83     Resp 11/03/22 1727 15     Temp 11/03/22 1727 98.3 F (36.8 C)     Temp Source 11/03/22 1727 Oral     SpO2 11/03/22 1727 98 %  Weight --      Height --      Head Circumference --      Peak Flow --      Pain Score 11/03/22 1728 8     Pain Loc --      Pain Education --      Exclude from Growth Chart --    No data found.  Updated Vital Signs BP 128/87 (BP Location: Left Arm)   Pulse 83   Temp 98.3 F (36.8 C) (Oral)   Resp 15   SpO2 98%   Visual Acuity Right Eye Distance:   Left Eye Distance:   Bilateral Distance:    Right Eye Near:   Left Eye Near:    Bilateral Near:     Physical Exam Vitals and nursing note reviewed.  Constitutional:      General: He is not in acute distress.    Appearance: He is well-developed.  HENT:     Head: Normocephalic and atraumatic.  Eyes:     Conjunctiva/sclera: Conjunctivae normal.  Cardiovascular:     Rate and Rhythm: Normal rate  and regular rhythm.     Heart sounds: No murmur heard. Pulmonary:     Effort: Pulmonary effort is normal. No respiratory distress.     Breath sounds: Normal breath sounds.  Abdominal:     Palpations: Abdomen is soft.     Tenderness: There is no abdominal tenderness.  Musculoskeletal:        General: No swelling.     Cervical back: Neck supple.  Skin:    General: Skin is warm and dry.     Capillary Refill: Capillary refill takes less than 2 seconds.  Neurological:     Mental Status: He is alert.  Psychiatric:        Mood and Affect: Mood normal.      UC Treatments / Results  Labs (all labs ordered are listed, but only abnormal results are displayed) Labs Reviewed  SARS CORONAVIRUS 2 (TAT 6-24 HRS)    EKG   Radiology No results found.  Procedures Procedures (including critical care time)  Medications Ordered in UC Medications - No data to display  Initial Impression / Assessment and Plan / UC Course  I have reviewed the triage vital signs and the nursing notes.  Pertinent labs & imaging results that were available during my care of the patient were reviewed by me and considered in my medical decision making (see chart for details).     URI.  COVID test pending.  Patient overall well-appearing in no acute distress.  Lungs clear to auscultation.  Supportive care discussed.  Return precautions discussed.  Work note given Final Clinical Impressions(s) / UC Diagnoses   Final diagnoses:  Acute upper respiratory infection     Discharge Instructions      Recommend Tylenol or Ibuprofen as needed for headeache and fever Can take Mucinex and use Flonase for congestion Can use cough syrup as needed for cough.  Recommend rest, drink plenty of fluids Will call with test results.      ED Prescriptions     Medication Sig Dispense Auth. Provider   promethazine-dextromethorphan (PROMETHAZINE-DM) 6.25-15 MG/5ML syrup Take 5 mLs by mouth 4 (four) times daily as needed  for cough. 118 mL Ward, Shanda Bumps Z, PA-C   fluticasone (FLONASE) 50 MCG/ACT nasal spray Place 1 spray into both nostrils daily. 9.9 mL Ward, Tylene Fantasia, PA-C      PDMP not reviewed this encounter.   Ward,  Tylene Fantasia, PA-C 11/03/22 1815

## 2022-11-04 LAB — SARS CORONAVIRUS 2 (TAT 6-24 HRS): SARS Coronavirus 2: POSITIVE — AB
